# Patient Record
Sex: Female | Born: 1950 | Race: White | Hispanic: No | Smoking: Never smoker
Health system: Southern US, Community
[De-identification: ages and names within clinical notes are randomized; demographics above are authoritative.]

## PROBLEM LIST (undated history)

## (undated) DIAGNOSIS — E785 Hyperlipidemia, unspecified: Secondary | ICD-10-CM

## (undated) DIAGNOSIS — H409 Unspecified glaucoma: Secondary | ICD-10-CM

## (undated) HISTORY — PX: BREAST BIOPSY: SHX20

## (undated) HISTORY — PX: ABDOMINAL HERNIA REPAIR: SHX539

## (undated) HISTORY — PX: CHOLECYSTECTOMY, LAPAROSCOPIC: SHX56

## (undated) HISTORY — PX: TONSILLECTOMY: SUR1361

---

## 1972-08-31 HISTORY — PX: APPENDECTOMY: SHX54

## 2018-07-21 ENCOUNTER — Other Ambulatory Visit: Payer: Self-pay | Admitting: Family Medicine

## 2018-07-21 DIAGNOSIS — Z1231 Encounter for screening mammogram for malignant neoplasm of breast: Secondary | ICD-10-CM

## 2019-03-07 ENCOUNTER — Other Ambulatory Visit (HOSPITAL_BASED_OUTPATIENT_CLINIC_OR_DEPARTMENT_OTHER): Payer: Self-pay | Admitting: Family Medicine

## 2019-03-07 DIAGNOSIS — R7989 Other specified abnormal findings of blood chemistry: Secondary | ICD-10-CM

## 2019-03-09 ENCOUNTER — Other Ambulatory Visit: Payer: Self-pay

## 2019-03-09 ENCOUNTER — Ambulatory Visit (HOSPITAL_BASED_OUTPATIENT_CLINIC_OR_DEPARTMENT_OTHER)
Admission: RE | Admit: 2019-03-09 | Discharge: 2019-03-09 | Disposition: A | Discharge status: Home / Self Care | Payer: Medicare Other | Source: Ambulatory Visit | Attending: Family Medicine | Admitting: Family Medicine

## 2019-03-09 ENCOUNTER — Encounter (HOSPITAL_BASED_OUTPATIENT_CLINIC_OR_DEPARTMENT_OTHER): Payer: Self-pay

## 2019-03-09 DIAGNOSIS — R945 Abnormal results of liver function studies: Secondary | ICD-10-CM | POA: Diagnosis not present

## 2019-03-09 DIAGNOSIS — R7989 Other specified abnormal findings of blood chemistry: Secondary | ICD-10-CM

## 2019-03-09 HISTORY — DX: Hyperlipidemia, unspecified: E78.5

## 2019-03-09 HISTORY — DX: Unspecified glaucoma: H40.9

## 2019-06-28 ENCOUNTER — Other Ambulatory Visit: Payer: Self-pay

## 2019-06-28 DIAGNOSIS — Z20822 Contact with and (suspected) exposure to covid-19: Secondary | ICD-10-CM

## 2019-06-29 LAB — NOVEL CORONAVIRUS, NAA: SARS-CoV-2, NAA: NOT DETECTED

## 2019-09-20 ENCOUNTER — Ambulatory Visit (INDEPENDENT_AMBULATORY_CARE_PROVIDER_SITE_OTHER): Payer: Medicare Other | Admitting: Otolaryngology

## 2019-09-20 ENCOUNTER — Other Ambulatory Visit: Payer: Self-pay

## 2019-09-20 ENCOUNTER — Encounter (INDEPENDENT_AMBULATORY_CARE_PROVIDER_SITE_OTHER): Payer: Self-pay | Admitting: Otolaryngology

## 2019-09-20 VITALS — Temp 98.1°F

## 2019-09-20 DIAGNOSIS — M26609 Unspecified temporomandibular joint disorder, unspecified side: Secondary | ICD-10-CM | POA: Diagnosis not present

## 2019-09-20 DIAGNOSIS — H9202 Otalgia, left ear: Secondary | ICD-10-CM | POA: Diagnosis not present

## 2019-09-20 NOTE — Progress Notes (Signed)
HPI: Robyn Bennett is a 69 y.o. female who presents is referred by Dr. Drema Dallas for evaluation of chronic left ear pain. This comes and goes. She has had this for a number of years. She has not noted any hearing problems. Denies any sore throat. She is not sure what makes it worse or better.. She has had no drainage from the ear.  Past Medical History:  Diagnosis Date  . Glaucoma    uses medication (drops)  . Hyperlipemia    meds    Social History   Socioeconomic History  . Marital status: Divorced    Spouse name: Not on file  . Number of children: Not on file  . Years of education: Not on file  . Highest education level: Not on file  Occupational History  . Not on file  Tobacco Use  . Smoking status: Never Smoker  . Smokeless tobacco: Never Used  Substance and Sexual Activity  . Alcohol use: Not Currently  . Drug use: Never  . Sexual activity: Not Currently    Birth control/protection: Post-menopausal  Other Topics Concern  . Not on file  Social History Narrative  . Not on file   Social Determinants of Health   Financial Resource Strain:   . Difficulty of Paying Living Expenses: Not on file  Food Insecurity:   . Worried About Charity fundraiser in the Last Year: Not on file  . Ran Out of Food in the Last Year: Not on file  Transportation Needs:   . Lack of Transportation (Medical): Not on file  . Lack of Transportation (Non-Medical): Not on file  Physical Activity:   . Days of Exercise per Week: Not on file  . Minutes of Exercise per Session: Not on file  Stress:   . Feeling of Stress : Not on file  Social Connections:   . Frequency of Communication with Friends and Family: Not on file  . Frequency of Social Gatherings with Friends and Family: Not on file  . Attends Religious Services: Not on file  . Active Member of Clubs or Organizations: Not on file  . Attends Archivist Meetings: Not on file  . Marital Status: Not on file   No family history  on file. Allergies  Allergen Reactions  . Aspirin     Cause her stomach to bleed   Prior to Admission medications   Not on File     Positive ROS: Otherwise negative  All other systems have been reviewed and were otherwise negative with the exception of those mentioned in the HPI and as above.  Physical Exam: Constitutional: Alert, well-appearing, no acute distress Ears: External ears without lesions or tenderness. She had moderate wax buildup on the right side that was removed with suction. The TM on the right side was normal with normal mobility on pneumatic otoscopy. Left ear canal had minimal cerumen. The TM itself was clear with good mobility on pneumatic otoscopy. On hearing screening with a 512 & 1024 tuning fork she heard well in both ears with AC > BC bilaterally. No pain on evaluation of the ear. Nasal: External nose without lesions. Septum with minimal deformity.. Clear nasal passages with no signs of infection. Oral: Lips and gums without lesions. Tongue and palate mucosa without lesions. Posterior oropharynx clear. Patient is status post tonsillectomy. Indirect laryngoscopy revealed a clear hypopharynx and larynx. Neck: No palpable adenopathy or masses. She does have mild clicking or popping on opening closing the left TMJ joint although  this is not really painful on exam today. Respiratory: Breathing comfortably  Skin: No facial/neck lesions or rash noted.  Procedures  Assessment: Left ear pain questionable etiology. Normal ear evaluation. 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  Plan: At this point since she has had this for years did not recommend any specific therapy outside of possibly use of NSAID such as ibuprofen or Advil twice daily for 7 to 10 days when the pain is worse.   Radene Journey, MD   CC: Leighton Ruff, MD

## 2020-01-04 ENCOUNTER — Encounter: Payer: Self-pay | Admitting: Neurology

## 2020-03-26 NOTE — Progress Notes (Signed)
NEUROLOGY CONSULTATION NOTE  Robyn Bennett MRN: [id number] DOB: 08/30/1951  Referring provider: Leighton Ruff, MD Primary care provider: Leighton Ruff, MD  Reason for consult:  headache  HISTORY OF PRESENT ILLNESS: Robyn Bennett is a 69 year old ambidextrous female with glaucoma and cataracts who presents for headache and dizziness  She has history of headaches with photophobia.    In June, she was watching TV and her vision became unfocused like cross-eyed.  She felt a severe pressure in her head, like a helmet over her head lasting 5 minutes.  She did not experience numbness or weakness.  She has never experienced this before.    She was evaluated by ENT in January for chronic left ear pain, who thought she may have mild left TMJ dysfunction.  Sleep hygiene:  Mother (recurrent TIAs), daughter (MS)  08/16/2019 LABS:  Hgb A1c 5.1; CMP with Na 142, K 5, Cl 106, CO2 26, glucose 111, BUN 6, Cr 0.56, t bili 0.7, ALP 79,  AST 26, ALT 27; LDL 110   PAST MEDICAL HISTORY: Past Medical History:  Diagnosis Date  . Glaucoma    uses medication (drops)  . Hyperlipemia    meds    PAST SURGICAL HISTORY: Past Surgical History:  Procedure Laterality Date  . ABDOMINAL HERNIA REPAIR     x 2  . APPENDECTOMY  1974  . CESAREAN SECTION  1975  . CHOLECYSTECTOMY, LAPAROSCOPIC    . TONSILLECTOMY     MEDICATIONS: No current outpatient medications on file prior to visit.   No current facility-administered medications on file prior to visit.    ALLERGIES: Allergies  Allergen Reactions  . Aspirin     Cause her stomach to bleed    FAMILY HISTORY: Family History  Problem Relation Age of Onset  . Stroke Mother   . Stroke Father   . Heart attack Father   . Multiple sclerosis Other     SOCIAL HISTORY: Social History   Socioeconomic History  . Marital status: Divorced    Spouse name: Not on file  . Number of children: Not on file  . Years of education: Not on file  .  Highest education level: Not on file  Occupational History  . Not on file  Tobacco Use  . Smoking status: Never Smoker  . Smokeless tobacco: Never Used  Vaping Use  . Vaping Use: Never used  Substance and Sexual Activity  . Alcohol use: Not Currently  . Drug use: Never  . Sexual activity: Not Currently    Birth control/protection: Post-menopausal  Other Topics Concern  . Not on file  Social History Narrative  . Not on file   Social Determinants of Health   Financial Resource Strain:   . Difficulty of Paying Living Expenses:   Food Insecurity:   . Worried About Charity fundraiser in the Last Year:   . Arboriculturist in the Last Year:   Transportation Needs:   . Film/video editor (Medical):   Marland Kitchen Lack of Transportation (Non-Medical):   Physical Activity:   . Days of Exercise per Week:   . Minutes of Exercise per Session:   Stress:   . Feeling of Stress :   Social Connections:   . Frequency of Communication with Friends and Family:   . Frequency of Social Gatherings with Friends and Family:   . Attends Religious Services:   . Active Member of Clubs or Organizations:   . Attends Archivist Meetings:   .  Marital Status:   Intimate Partner Violence:   . Fear of Current or Ex-Partner:   . Emotionally Abused:   Marland Kitchen Physically Abused:   . Sexually Abused:     PHYSICAL EXAM: Blood pressure (!) 141/79, pulse 80, height 4\' 10"  (1.473 m), weight 164 lb 9.6 oz (74.7 kg), SpO2 96 %. General: No acute distress.  Patient appears well-groomed.   Head:  Normocephalic/atraumatic Eyes:  fundi examined but not visualized Neck: supple, no paraspinal tenderness, full range of motion Back: No paraspinal tenderness Heart: regular rate and rhythm Lungs: Clear to auscultation bilaterally. Vascular: No carotid bruits. Neurological Exam: Mental status: alert and oriented to person, place, and time, recent and remote memory intact, fund of knowledge intact, attention and  concentration intact, speech fluent and not dysarthric, language intact. Cranial nerves: CN I: not tested CN II: pupils equal, round and reactive to light, visual fields intact CN III, IV, VI:  full range of motion, no nystagmus, no ptosis CN V: facial sensation intact CN VII: upper and lower face symmetric CN VIII: hearing intact CN IX, X: gag intact, uvula midline CN XI: sternocleidomastoid and trapezius muscles intact CN XII: tongue midline Bulk & Tone: normal, no fasciculations. Motor:  5/5 throughout  Sensation:  Pinprick and vibration sensation intact.   Deep Tendon Reflexes:  2+ throughout, toes downgoing.  Finger to nose testing:  Without dysmetria.   Heel to shin:  Without dysmetria.   Gait:  Normal station and stride.  Able to turn and tandem walk. Romberg negative.  IMPRESSION: Transient episode of visual disturbance and headache, new onset  PLAN: MRI of brain with and without contrast Further recommendations pending results.  Thank you for allowing me to take part in the care of this patient.  Metta Clines, DO  CC:  Leighton Ruff, MD

## 2020-03-27 ENCOUNTER — Encounter: Payer: Self-pay | Admitting: Neurology

## 2020-03-27 ENCOUNTER — Other Ambulatory Visit: Payer: Self-pay

## 2020-03-27 ENCOUNTER — Ambulatory Visit (INDEPENDENT_AMBULATORY_CARE_PROVIDER_SITE_OTHER): Payer: Medicare Other | Admitting: Neurology

## 2020-03-27 VITALS — BP 141/79 | HR 80 | Ht <= 58 in | Wt 164.6 lb

## 2020-03-27 DIAGNOSIS — R519 Headache, unspecified: Secondary | ICD-10-CM | POA: Diagnosis not present

## 2020-03-27 DIAGNOSIS — H539 Unspecified visual disturbance: Secondary | ICD-10-CM

## 2020-03-27 NOTE — Patient Instructions (Signed)
We will check MRI of brain with and without contrast Further recommendations pending results.   

## 2020-04-20 ENCOUNTER — Ambulatory Visit
Admission: RE | Admit: 2020-04-20 | Discharge: 2020-04-20 | Disposition: A | Discharge status: Home / Self Care | Payer: Medicare Other | Source: Ambulatory Visit | Attending: Neurology | Admitting: Neurology

## 2020-04-20 ENCOUNTER — Other Ambulatory Visit: Payer: Self-pay

## 2020-04-20 DIAGNOSIS — H539 Unspecified visual disturbance: Secondary | ICD-10-CM

## 2020-04-20 DIAGNOSIS — R519 Headache, unspecified: Secondary | ICD-10-CM

## 2020-04-20 MED ORDER — GADOBENATE DIMEGLUMINE 529 MG/ML IV SOLN
13.0000 mL | Freq: Once | INTRAVENOUS | Status: AC | PRN
  Administered 2020-04-20: 13 mL via INTRAVENOUS

## 2020-09-12 DIAGNOSIS — R030 Elevated blood-pressure reading, without diagnosis of hypertension: Secondary | ICD-10-CM | POA: Diagnosis not present

## 2020-09-12 DIAGNOSIS — R519 Headache, unspecified: Secondary | ICD-10-CM | POA: Diagnosis not present

## 2020-10-07 DIAGNOSIS — H04123 Dry eye syndrome of bilateral lacrimal glands: Secondary | ICD-10-CM | POA: Diagnosis not present

## 2020-10-07 DIAGNOSIS — H401131 Primary open-angle glaucoma, bilateral, mild stage: Secondary | ICD-10-CM | POA: Diagnosis not present

## 2020-10-10 DIAGNOSIS — R0981 Nasal congestion: Secondary | ICD-10-CM | POA: Diagnosis not present

## 2020-10-10 DIAGNOSIS — E782 Mixed hyperlipidemia: Secondary | ICD-10-CM | POA: Diagnosis not present

## 2020-10-10 DIAGNOSIS — H04123 Dry eye syndrome of bilateral lacrimal glands: Secondary | ICD-10-CM | POA: Diagnosis not present

## 2020-10-10 DIAGNOSIS — K219 Gastro-esophageal reflux disease without esophagitis: Secondary | ICD-10-CM | POA: Diagnosis not present

## 2020-10-10 DIAGNOSIS — R0989 Other specified symptoms and signs involving the circulatory and respiratory systems: Secondary | ICD-10-CM | POA: Diagnosis not present

## 2020-10-10 DIAGNOSIS — E6609 Other obesity due to excess calories: Secondary | ICD-10-CM | POA: Diagnosis not present

## 2020-10-10 DIAGNOSIS — H409 Unspecified glaucoma: Secondary | ICD-10-CM | POA: Diagnosis not present

## 2020-10-10 DIAGNOSIS — Z6834 Body mass index (BMI) 34.0-34.9, adult: Secondary | ICD-10-CM | POA: Diagnosis not present

## 2020-10-14 DIAGNOSIS — R0989 Other specified symptoms and signs involving the circulatory and respiratory systems: Secondary | ICD-10-CM | POA: Diagnosis not present

## 2020-10-14 DIAGNOSIS — E782 Mixed hyperlipidemia: Secondary | ICD-10-CM | POA: Diagnosis not present

## 2020-10-14 DIAGNOSIS — R0981 Nasal congestion: Secondary | ICD-10-CM | POA: Diagnosis not present

## 2020-10-14 DIAGNOSIS — R7301 Impaired fasting glucose: Secondary | ICD-10-CM | POA: Diagnosis not present

## 2020-10-14 DIAGNOSIS — H04123 Dry eye syndrome of bilateral lacrimal glands: Secondary | ICD-10-CM | POA: Diagnosis not present

## 2020-10-14 DIAGNOSIS — H409 Unspecified glaucoma: Secondary | ICD-10-CM | POA: Diagnosis not present

## 2020-10-14 DIAGNOSIS — E6609 Other obesity due to excess calories: Secondary | ICD-10-CM | POA: Diagnosis not present

## 2020-10-14 DIAGNOSIS — Z6834 Body mass index (BMI) 34.0-34.9, adult: Secondary | ICD-10-CM | POA: Diagnosis not present

## 2020-10-14 DIAGNOSIS — K219 Gastro-esophageal reflux disease without esophagitis: Secondary | ICD-10-CM | POA: Diagnosis not present

## 2021-01-03 DIAGNOSIS — R109 Unspecified abdominal pain: Secondary | ICD-10-CM | POA: Diagnosis not present

## 2021-01-06 ENCOUNTER — Other Ambulatory Visit: Payer: Self-pay | Admitting: Family Medicine

## 2021-01-06 DIAGNOSIS — Z1231 Encounter for screening mammogram for malignant neoplasm of breast: Secondary | ICD-10-CM

## 2021-01-06 DIAGNOSIS — R109 Unspecified abdominal pain: Secondary | ICD-10-CM | POA: Diagnosis not present

## 2021-01-08 ENCOUNTER — Inpatient Hospital Stay: Admission: RE | Admit: 2021-01-08 | Payer: Medicare Other | Source: Ambulatory Visit

## 2021-01-18 ENCOUNTER — Ambulatory Visit
Admission: RE | Admit: 2021-01-18 | Discharge: 2021-01-18 | Disposition: A | Discharge status: Home / Self Care | Payer: Medicare Other | Source: Ambulatory Visit | Attending: Family Medicine | Admitting: Family Medicine

## 2021-01-18 ENCOUNTER — Other Ambulatory Visit: Payer: Self-pay

## 2021-01-18 DIAGNOSIS — Z1231 Encounter for screening mammogram for malignant neoplasm of breast: Secondary | ICD-10-CM

## 2021-01-30 ENCOUNTER — Other Ambulatory Visit: Payer: Self-pay | Admitting: Family Medicine

## 2021-01-30 DIAGNOSIS — R928 Other abnormal and inconclusive findings on diagnostic imaging of breast: Secondary | ICD-10-CM

## 2021-02-11 DIAGNOSIS — Z23 Encounter for immunization: Secondary | ICD-10-CM | POA: Diagnosis not present

## 2021-02-11 DIAGNOSIS — K7689 Other specified diseases of liver: Secondary | ICD-10-CM | POA: Diagnosis not present

## 2021-02-11 DIAGNOSIS — E782 Mixed hyperlipidemia: Secondary | ICD-10-CM | POA: Diagnosis not present

## 2021-02-11 DIAGNOSIS — K219 Gastro-esophageal reflux disease without esophagitis: Secondary | ICD-10-CM | POA: Diagnosis not present

## 2021-02-11 DIAGNOSIS — R109 Unspecified abdominal pain: Secondary | ICD-10-CM | POA: Diagnosis not present

## 2021-02-11 DIAGNOSIS — E6609 Other obesity due to excess calories: Secondary | ICD-10-CM | POA: Diagnosis not present

## 2021-02-11 DIAGNOSIS — H409 Unspecified glaucoma: Secondary | ICD-10-CM | POA: Diagnosis not present

## 2021-02-12 ENCOUNTER — Other Ambulatory Visit: Payer: Self-pay | Admitting: Family Medicine

## 2021-02-12 DIAGNOSIS — H401131 Primary open-angle glaucoma, bilateral, mild stage: Secondary | ICD-10-CM | POA: Diagnosis not present

## 2021-02-12 DIAGNOSIS — H04123 Dry eye syndrome of bilateral lacrimal glands: Secondary | ICD-10-CM | POA: Diagnosis not present

## 2021-02-19 ENCOUNTER — Ambulatory Visit
Admission: RE | Admit: 2021-02-19 | Discharge: 2021-02-19 | Disposition: A | Discharge status: Home / Self Care | Payer: Medicare Other | Source: Ambulatory Visit | Attending: Family Medicine | Admitting: Family Medicine

## 2021-02-19 ENCOUNTER — Other Ambulatory Visit: Payer: Self-pay | Admitting: Family Medicine

## 2021-02-19 ENCOUNTER — Other Ambulatory Visit: Payer: Self-pay

## 2021-02-19 DIAGNOSIS — R922 Inconclusive mammogram: Secondary | ICD-10-CM | POA: Diagnosis not present

## 2021-02-19 DIAGNOSIS — R109 Unspecified abdominal pain: Secondary | ICD-10-CM

## 2021-02-19 DIAGNOSIS — R928 Other abnormal and inconclusive findings on diagnostic imaging of breast: Secondary | ICD-10-CM

## 2021-02-19 DIAGNOSIS — N6002 Solitary cyst of left breast: Secondary | ICD-10-CM | POA: Diagnosis not present

## 2021-03-14 ENCOUNTER — Ambulatory Visit
Admission: RE | Admit: 2021-03-14 | Discharge: 2021-03-14 | Disposition: A | Discharge status: Home / Self Care | Payer: Medicare Other | Source: Ambulatory Visit | Attending: Family Medicine | Admitting: Family Medicine

## 2021-03-14 DIAGNOSIS — R1011 Right upper quadrant pain: Secondary | ICD-10-CM | POA: Diagnosis not present

## 2021-03-14 DIAGNOSIS — Z9049 Acquired absence of other specified parts of digestive tract: Secondary | ICD-10-CM | POA: Diagnosis not present

## 2021-03-14 DIAGNOSIS — R109 Unspecified abdominal pain: Secondary | ICD-10-CM

## 2021-03-14 DIAGNOSIS — K7689 Other specified diseases of liver: Secondary | ICD-10-CM | POA: Diagnosis not present

## 2021-04-14 DIAGNOSIS — K7689 Other specified diseases of liver: Secondary | ICD-10-CM | POA: Diagnosis not present

## 2021-04-14 DIAGNOSIS — E6609 Other obesity due to excess calories: Secondary | ICD-10-CM | POA: Diagnosis not present

## 2021-04-14 DIAGNOSIS — K219 Gastro-esophageal reflux disease without esophagitis: Secondary | ICD-10-CM | POA: Diagnosis not present

## 2021-04-14 DIAGNOSIS — H409 Unspecified glaucoma: Secondary | ICD-10-CM | POA: Diagnosis not present

## 2021-04-14 DIAGNOSIS — R109 Unspecified abdominal pain: Secondary | ICD-10-CM | POA: Diagnosis not present

## 2021-04-14 DIAGNOSIS — E782 Mixed hyperlipidemia: Secondary | ICD-10-CM | POA: Diagnosis not present

## 2021-05-13 ENCOUNTER — Other Ambulatory Visit: Payer: Self-pay

## 2021-05-13 ENCOUNTER — Encounter: Payer: Self-pay | Admitting: Dietician

## 2021-05-13 ENCOUNTER — Encounter: Payer: Medicare Other | Attending: Family Medicine | Admitting: Dietician

## 2021-05-13 VITALS — Ht <= 58 in | Wt 162.0 lb

## 2021-05-13 DIAGNOSIS — R7301 Impaired fasting glucose: Secondary | ICD-10-CM | POA: Diagnosis not present

## 2021-05-13 DIAGNOSIS — E669 Obesity, unspecified: Secondary | ICD-10-CM

## 2021-05-13 DIAGNOSIS — E781 Pure hyperglyceridemia: Secondary | ICD-10-CM | POA: Diagnosis not present

## 2021-05-13 DIAGNOSIS — E6609 Other obesity due to excess calories: Secondary | ICD-10-CM | POA: Insufficient documentation

## 2021-05-13 DIAGNOSIS — Z713 Dietary counseling and surveillance: Secondary | ICD-10-CM | POA: Diagnosis not present

## 2021-05-13 DIAGNOSIS — E785 Hyperlipidemia, unspecified: Secondary | ICD-10-CM | POA: Insufficient documentation

## 2021-05-13 DIAGNOSIS — Z6833 Body mass index (BMI) 33.0-33.9, adult: Secondary | ICD-10-CM | POA: Insufficient documentation

## 2021-05-13 NOTE — Patient Instructions (Addendum)
When having vegetables, make sure they are soft cooked and not raw. Try this for a few weeks and assess stomach pain.  Switch from eating nuts and seeds to nut butters (peanut butter, almond butter, cashew butter) or dehydrated PB for protein. Assess any change in lower stomach discomfort.  Begin a log/journal of your food choices and gastrointestinal symptoms. Bring your log to our follow up.   Portion your almonds into a small bowl if you are going to snack on them. Do not bring the entire bag with you. Choose a cup of diced/bite size fruits to snack on instead.   Try Fairlife protein drinks as part of breakfast.  Take a walk on your lunch break at work as many days as possible!

## 2021-05-13 NOTE — Progress Notes (Signed)
Medical Nutrition Therapy  Appointment Start time:  [id number]  Appointment End time:  0945  Primary concerns today: GI Discomfort, Fatty Liver  Referral diagnosis: E78.1 - Hypertriglyceridemia, E66.09 - Other obesity due to excess calories, R73.01 - Elevated fasting glucose Preferred learning style: No preference indicated Learning readiness: Ready   NUTRITION ASSESSMENT   Anthropometrics  Ht: 4'10" Wt: 162 lbs Body mass index is 33.86 kg/m.   Clinical Medical Hx: HLD, Diverticulitis, Gastrits, GERD, Fatty Liver, Ulcers Medications: Pantoprazole, Ezetimibe, Alphagan Labs: TC - 269, TGL - 279, LDL - 172 Notable Signs/Symptoms: RLQ pain  Lifestyle & Dietary Hx Pt reports history of diverticulitis in their RLQ, has not had a colonoscopy in 8-10 years. Pt reports consistent RLQ pain, has appointment with a gastroenterologist later this month. Pt reports history of fatty liver and 2 small cysts on their liver. Pt just started taking milk thistle for their liver. 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 Pt takes an hour lunch break each day.No structured exercise or physical activity. Pt does not eat meat very often, may have poultry rarely. Pt eats nuts, seeds, lentils, quinoa, beans, and egg whites for protein.  Pt reports snacking on almonds in the evening while watching TV, states they do not pay attention to how much they eat. Pt is concerned that they are eating too many calories without realizing it. Pt is lactose intolerant.   Estimated daily fluid intake: 48 oz Supplements: Probiotic, Milk Thistle, Vitamin B12 Sleep: Poor, trouble staying asleep Stress / self-care: Work stress, co-workers Current average weekly physical activity: ADLs   24-Hr Dietary Recall First Meal: 1 cup off coffee, 1 slice whole grain toast Snack: none Second Meal: Quinoa, feta cheese, cooked spinach, avocado, lemon juice Snack: none Third Meal: [place] sandwich on whole wheat bread,  Ginger ale Zero Snack: Almonds Beverages: coffee, ginger ale.   NUTRITION DIAGNOSIS  NB-1.7 Undesireable food choices As related to fatty liver.  As evidenced by serum triglycerides of 279 mg/dL, serum LDL of 172 mg/dL,and diet history high in fat.   NUTRITION INTERVENTION  Nutrition education (E-1) on the following topics:  Educated patient on the balanced plate eating model. Recommended lunch and dinner be 1/2 non-starchy vegetables, 1/4 starches, and 1/4 protein. Recommended breakfast be a balance of starch and protein with a piece of fruit. Discussed with patient the importance of working towards hitting the proportions of the balanced plate consistently. Counseled patient on ways to begin recognizing each of the food groups from the balanced plate in their own meals, and how close they are to fitting the recommended proportions of the balanced plate. Advised patient to evaluate whether the impulse to eat is hunger based, or boredom driven snacking. Educate pt on the difference between LDL and HDL cholesterol. Educate pt on the factors that can increase and decrease HDL cholesterol. Including exercise to increase HDL, and tobacco use to decrease HDL. Educate pt on factors that can elevate LDL cholesterol, including high dietary intake of saturated fats. Educate pt on identifying sources of saturated fats, and how to make alternative food choices to lower saturated fat intake. Educate pt on the role of soluble fiber in binding to cholesterol in the GI tract an eliminating it from the body. Educate pt on dietary sources of soluble fiber. Educate pt on the potential dietary causes of hypertriglyceridemia.Educate on the role of elevated LDL,total cholesterol, and triglycerides on cardiovascular health. Educate pt on the role of physical activity in lowering  LDL and increasing HDL cholesterol. Educated patient on dietary items that may exacerbate their diverticulitis/RLQ pain. Advise patient to soft cook  vegetables, and choose nut butters instead of whole nuts and seeds.   Handouts Provided Include  Balanced Plate Balanced Plate Food List Snack Time  Learning Style & Readiness for Change Teaching method utilized: Visual & Auditory  Demonstrated degree of understanding via: Teach Back  Barriers to learning/adherence to lifestyle change: None  Goals Established by Pt When having vegetables, make sure they are soft cooked and not raw. Try this for a few weeks and assess stomach pain. Switch from eating nuts and seeds to nut butters (peanut butter, almond butter, cashew butter) or dehydrated PB for protein. Assess any change in lower stomach discomfort. Begin a log/journal of your food choices and gastrointestinal symptoms. Bring your log to our follow up.  Portion your almonds into a small bowl if you are going to snack on them. Do not bring the entire bag with you. Choose a cup of diced/bite size fruits to snack on instead.  Try Fairlife protein drinks as part of breakfast. Take a walk on your lunch break at work as many days as possible!   MONITORING & EVALUATION Dietary intake, weekly physical activity, and food/symptoms log in 6 weeks.  Next Steps  Patient is to log their food choices and related symptoms, bring log to follow up.

## 2021-05-20 DIAGNOSIS — R109 Unspecified abdominal pain: Secondary | ICD-10-CM | POA: Diagnosis not present

## 2021-05-20 DIAGNOSIS — Z8719 Personal history of other diseases of the digestive system: Secondary | ICD-10-CM | POA: Diagnosis not present

## 2021-05-20 DIAGNOSIS — K219 Gastro-esophageal reflux disease without esophagitis: Secondary | ICD-10-CM | POA: Diagnosis not present

## 2021-05-20 DIAGNOSIS — Z86010 Personal history of colonic polyps: Secondary | ICD-10-CM | POA: Diagnosis not present

## 2021-05-20 DIAGNOSIS — K760 Fatty (change of) liver, not elsewhere classified: Secondary | ICD-10-CM | POA: Diagnosis not present

## 2021-06-10 DIAGNOSIS — D123 Benign neoplasm of transverse colon: Secondary | ICD-10-CM | POA: Diagnosis not present

## 2021-06-10 DIAGNOSIS — R1013 Epigastric pain: Secondary | ICD-10-CM | POA: Diagnosis not present

## 2021-06-10 DIAGNOSIS — K2930 Chronic superficial gastritis without bleeding: Secondary | ICD-10-CM | POA: Diagnosis not present

## 2021-06-10 DIAGNOSIS — Z86010 Personal history of colonic polyps: Secondary | ICD-10-CM | POA: Diagnosis not present

## 2021-06-10 DIAGNOSIS — K5730 Diverticulosis of large intestine without perforation or abscess without bleeding: Secondary | ICD-10-CM | POA: Diagnosis not present

## 2021-06-16 DIAGNOSIS — H04123 Dry eye syndrome of bilateral lacrimal glands: Secondary | ICD-10-CM | POA: Diagnosis not present

## 2021-06-16 DIAGNOSIS — H2513 Age-related nuclear cataract, bilateral: Secondary | ICD-10-CM | POA: Diagnosis not present

## 2021-06-16 DIAGNOSIS — H401131 Primary open-angle glaucoma, bilateral, mild stage: Secondary | ICD-10-CM | POA: Diagnosis not present

## 2021-06-19 DIAGNOSIS — K2930 Chronic superficial gastritis without bleeding: Secondary | ICD-10-CM | POA: Diagnosis not present

## 2021-06-19 DIAGNOSIS — D123 Benign neoplasm of transverse colon: Secondary | ICD-10-CM | POA: Diagnosis not present

## 2021-07-01 ENCOUNTER — Encounter: Payer: Self-pay | Admitting: Dietician

## 2021-07-01 ENCOUNTER — Other Ambulatory Visit: Payer: Self-pay

## 2021-07-01 ENCOUNTER — Encounter: Payer: Medicare Other | Attending: Family Medicine | Admitting: Dietician

## 2021-07-01 VITALS — Ht <= 58 in | Wt 154.9 lb

## 2021-07-01 DIAGNOSIS — R7301 Impaired fasting glucose: Secondary | ICD-10-CM | POA: Diagnosis not present

## 2021-07-01 DIAGNOSIS — K219 Gastro-esophageal reflux disease without esophagitis: Secondary | ICD-10-CM | POA: Diagnosis not present

## 2021-07-01 DIAGNOSIS — E781 Pure hyperglyceridemia: Secondary | ICD-10-CM | POA: Diagnosis not present

## 2021-07-01 DIAGNOSIS — E6609 Other obesity due to excess calories: Secondary | ICD-10-CM | POA: Insufficient documentation

## 2021-07-01 DIAGNOSIS — Z6832 Body mass index (BMI) 32.0-32.9, adult: Secondary | ICD-10-CM | POA: Diagnosis not present

## 2021-07-01 DIAGNOSIS — E669 Obesity, unspecified: Secondary | ICD-10-CM | POA: Insufficient documentation

## 2021-07-01 NOTE — Progress Notes (Signed)
Medical Nutrition Therapy  Appointment Start time:  0800  Appointment End time:  [phone]  Primary concerns today: GI Discomfort, Fatty Liver  Referral diagnosis: E78.1 - Hypertriglyceridemia, E66.09 - Other obesity due to excess calories, R73.01 - Elevated fasting glucose Preferred learning style: No preference indicated Learning readiness: Ready   NUTRITION ASSESSMENT   Anthropometrics  Ht: 4'10" Wt: 154.9 lbs Wt Change: -8 lbs Body mass index is 32.37 kg/m.   Clinical Medical Hx: HLD, Diverticulitis, Gastrits, GERD, Fatty Liver, Ulcers Medications: Pantoprazole, Ezetimibe, Alphagan Labs: TC - 269, TGL - 279, LDL - 172 Notable Signs/Symptoms: RLQ pain  Lifestyle & Dietary Hx Pt reports logging food choices, states it helps them to focus on what they are eating.Pt has been snacking less and eating more mindfully since logging foods. Pt reports losing weight for the first three weeks, but weight loss has plateaued in the last month. Pt had endoscopy and colonoscopy, removed a polyp. Gastritis and diverticulitis still present, no other abnormal findings. Pt is eating yogurt now, no side effects. Pt has been logging foods, found that whole nuts were causing GI pain. Pt reports eating more fruits now, melons, grapes, and strawberries. Pt noticed strawberries were causing GI discomfort, switched to blueberries.  Pt reports taking a break each day walking around the block at work each day. Pt reports stress level at work is much lower now.  Estimated daily fluid intake: 48 oz Supplements: Probiotic, Milk Thistle, Vitamin B12 Sleep: Poor, trouble staying asleep Stress / self-care: Work stress, co-workers Current average weekly physical activity: ADLs    24-Hr Dietary Recall First Meal: 1 cup off coffee, 1 slice whole grain toast, 1 egg white Snack: 1 banana Second Meal: Fruit (melon mix, grapes, pineapple) and greek yogurt Snack: none Third Meal: [place] Sprouts, peppers, 1  egg Snack: none Beverages: coffee, water   NUTRITION DIAGNOSIS  NB-1.7 Undesireable food choices As related to fatty liver.  As evidenced by serum triglycerides of 279 mg/dL, serum LDL of 172 mg/dL,and diet history high in fat.   NUTRITION INTERVENTION  Nutrition education (E-1) on the following topics:  Educated patient on the balanced plate eating model. Recommended lunch and dinner be 1/2 non-starchy vegetables, 1/4 starches, and 1/4 protein. Recommended breakfast be a balance of starch and protein with a piece of fruit. Discussed with patient the importance of working towards hitting the proportions of the balanced plate consistently. Counseled patient on ways to begin recognizing each of the food groups from the balanced plate in their own meals, and how close they are to fitting the recommended proportions of the balanced plate. Advised patient to evaluate whether the impulse to eat is hunger based, or boredom driven snacking. Educate pt on the difference between LDL and HDL cholesterol. Educate pt on the factors that can increase and decrease HDL cholesterol. Including exercise to increase HDL, and tobacco use to decrease HDL. Educate pt on factors that can elevate LDL cholesterol, including high dietary intake of saturated fats. Educate pt on identifying sources of saturated fats, and how to make alternative food choices to lower saturated fat intake. Educate pt on the role of soluble fiber in binding to cholesterol in the GI tract an eliminating it from the body. Educate pt on dietary sources of soluble fiber. Educate pt on the potential dietary causes of hypertriglyceridemia.Educate on the role of elevated LDL,total cholesterol, and triglycerides on cardiovascular health. Educate pt on the role of physical activity in lowering LDL and increasing HDL cholesterol. Educated patient  on dietary items that may exacerbate their diverticulitis/RLQ pain. Advise patient to soft cook vegetables, and  choose nut butters instead of whole nuts and seeds. NEW: Educated patient on the two components of energy balance: Energy in (calories), and energy out (activity). Explain the role of negative energy balance in weight loss. Discussed options with patient to achieve a negative energy balance and how to best control energy in and energy out to accommodate their lifestyle.    Handouts Provided Include  Balanced Plate Balanced Plate Food List Snack Time  Learning Style & Readiness for Change Teaching method utilized: Visual & Auditory  Demonstrated degree of understanding via: Teach Back  Barriers to learning/adherence to lifestyle change: None  Goals Established by Pt Add in a second walk around the block in the afternoon! When having your banana, chop up 1/2 of a banana for your serving size! Choose FAT-FREE greek yogurt like [employer]ikos "Triple Zero", Dannon "Light n Fit".  If buying larger containers of yogurt, look on the nutrition label for about 80-90 calories, and 12-15 g of protein per serving! Continue to log your food choices each day!   MONITORING & EVALUATION Dietary intake, weekly physical activity, and food/symptoms log in 2 months.  Next Steps  Patient is to log their food choices and related symptoms, bring log to follow up.

## 2021-07-01 NOTE — Patient Instructions (Addendum)
Add in a second walk around the block in the afternoon!  When having your banana, chop up 1/2 of a banana for your serving size!  Choose FAT-FREE greek yogurt like Oikos "Triple Zero", Dannon "Light n Fit".  If buying larger containers of yogurt, look on the nutrition label for about 80-90 calories, and 12-15 g of protein per serving!  Continue to log your food choices each day!

## 2021-07-03 DIAGNOSIS — K7689 Other specified diseases of liver: Secondary | ICD-10-CM | POA: Diagnosis not present

## 2021-07-03 DIAGNOSIS — E6609 Other obesity due to excess calories: Secondary | ICD-10-CM | POA: Diagnosis not present

## 2021-07-03 DIAGNOSIS — M1711 Unilateral primary osteoarthritis, right knee: Secondary | ICD-10-CM | POA: Diagnosis not present

## 2021-07-03 DIAGNOSIS — Z86010 Personal history of colonic polyps: Secondary | ICD-10-CM | POA: Diagnosis not present

## 2021-07-03 DIAGNOSIS — Z23 Encounter for immunization: Secondary | ICD-10-CM | POA: Diagnosis not present

## 2021-07-03 DIAGNOSIS — K219 Gastro-esophageal reflux disease without esophagitis: Secondary | ICD-10-CM | POA: Diagnosis not present

## 2021-07-03 DIAGNOSIS — H409 Unspecified glaucoma: Secondary | ICD-10-CM | POA: Diagnosis not present

## 2021-07-03 DIAGNOSIS — E782 Mixed hyperlipidemia: Secondary | ICD-10-CM | POA: Diagnosis not present

## 2021-07-08 DIAGNOSIS — E782 Mixed hyperlipidemia: Secondary | ICD-10-CM | POA: Diagnosis not present

## 2021-07-11 DIAGNOSIS — Z86010 Personal history of colonic polyps: Secondary | ICD-10-CM | POA: Diagnosis not present

## 2021-07-11 DIAGNOSIS — Z23 Encounter for immunization: Secondary | ICD-10-CM | POA: Diagnosis not present

## 2021-07-11 DIAGNOSIS — K5730 Diverticulosis of large intestine without perforation or abscess without bleeding: Secondary | ICD-10-CM | POA: Diagnosis not present

## 2021-07-11 DIAGNOSIS — K2930 Chronic superficial gastritis without bleeding: Secondary | ICD-10-CM | POA: Diagnosis not present

## 2021-07-23 DIAGNOSIS — Z20822 Contact with and (suspected) exposure to covid-19: Secondary | ICD-10-CM | POA: Diagnosis not present

## 2021-08-21 ENCOUNTER — Other Ambulatory Visit: Payer: Self-pay | Admitting: Family Medicine

## 2021-08-21 ENCOUNTER — Ambulatory Visit
Admission: RE | Admit: 2021-08-21 | Discharge: 2021-08-21 | Disposition: A | Discharge status: Home / Self Care | Payer: Medicare Other | Source: Ambulatory Visit | Attending: Family Medicine | Admitting: Family Medicine

## 2021-08-21 DIAGNOSIS — R928 Other abnormal and inconclusive findings on diagnostic imaging of breast: Secondary | ICD-10-CM

## 2021-08-21 DIAGNOSIS — N6322 Unspecified lump in the left breast, upper inner quadrant: Secondary | ICD-10-CM | POA: Diagnosis not present

## 2021-09-09 ENCOUNTER — Encounter: Payer: Medicare Other | Attending: Family Medicine | Admitting: Dietician

## 2021-09-09 ENCOUNTER — Other Ambulatory Visit: Payer: Self-pay

## 2021-09-09 VITALS — Ht <= 58 in | Wt 150.8 lb

## 2021-09-09 DIAGNOSIS — E781 Pure hyperglyceridemia: Secondary | ICD-10-CM | POA: Insufficient documentation

## 2021-09-09 DIAGNOSIS — Z6831 Body mass index (BMI) 31.0-31.9, adult: Secondary | ICD-10-CM | POA: Diagnosis not present

## 2021-09-09 DIAGNOSIS — R7301 Impaired fasting glucose: Secondary | ICD-10-CM | POA: Diagnosis not present

## 2021-09-09 DIAGNOSIS — Z713 Dietary counseling and surveillance: Secondary | ICD-10-CM | POA: Insufficient documentation

## 2021-09-09 DIAGNOSIS — E6609 Other obesity due to excess calories: Secondary | ICD-10-CM | POA: Diagnosis not present

## 2021-09-09 DIAGNOSIS — E782 Mixed hyperlipidemia: Secondary | ICD-10-CM | POA: Insufficient documentation

## 2021-09-09 NOTE — Patient Instructions (Addendum)
Keep up the great work!!! 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  Continue to walk every day and get back to your longer walks twice a day as your knee allows!  Work on having a source of protein at each meal!

## 2021-09-09 NOTE — Progress Notes (Signed)
Medical Nutrition Therapy  Appointment Start time:  0800  Appointment End time:  [phone]  Primary concerns today: GI Discomfort, Fatty Liver  Referral diagnosis: E78.1 - Hypertriglyceridemia, E66.09 - Other obesity due to excess calories, R73.01 - Elevated fasting glucose Preferred learning style: No preference indicated Learning readiness: Ready   NUTRITION ASSESSMENT   Anthropometrics  Ht: 4'10" Wt: 150.8 lbs Wt Change: -12 lbs Body mass index is 31.52 kg/m.   Clinical Medical Hx: HLD, Diverticulitis, Gastrits, GERD, Fatty Liver, Ulcers Medications: Pantoprazole, Ezetimibe, Alphagan Labs: (NEW 07/08/2021) TC - 207, TGL - 174, LDL - 135 All dramatically improved Notable Signs/Symptoms: RLQ pain  Lifestyle & Dietary Hx Pt reports going to visit family over the holidays, states they didn't eat as well or walk while on vacation. Pt states their family noticed their weight loss, and increase in energy level. This made pt very happy. Pt has begun to walk again since returning home. Pt reports some instability in their right knee that makes them uncomfortable at times, has history of ligament injury. Pt reports lingering RLQ pain, believes it may be related to hip pain. Pt reports changing positions at work, states they have an isolated space which has lowered stress for them considerably. Pt has continued with healthy dietary changes and continues to stay highly motivated!    Estimated daily fluid intake: 48 oz Supplements: Probiotic, Milk Thistle, Vitamin B12 Sleep: Poor, trouble staying asleep Stress / self-care: Work stress, co-workers Current average weekly physical activity: ADLs, walking    24-Hr Dietary Recall First Meal: 1 cup off coffee, 1 slice whole grain toast, 1/2 avocado Snack: greek yogurt Second Meal: Frozen fruit (cherries, pineapple, coconut, blueberries) Snack: none Third Meal: Spaghetti w/ meat sauce Snack: none Beverages: coffee, water   NUTRITION  DIAGNOSIS  NB-1.7 Undesireable food choices As related to fatty liver.  As evidenced by serum triglycerides of 279 mg/dL, serum LDL of 172 mg/dL,and diet history high in fat.   NUTRITION INTERVENTION  Nutrition education (E-1) on the following topics:  Educated patient on the balanced plate eating model. Recommended lunch and dinner be 1/2 non-starchy vegetables, 1/4 starches, and 1/4 protein. Recommended breakfast be a balance of starch and protein with a piece of fruit. Discussed with patient the importance of working towards hitting the proportions of the balanced plate consistently. Counseled patient on ways to begin recognizing each of the food groups from the balanced plate in their own meals, and how close they are to fitting the recommended proportions of the balanced plate. Advised patient to evaluate whether the impulse to eat is hunger based, or boredom driven snacking. Educate pt on the difference between LDL and HDL cholesterol. Educate pt on the factors that can increase and decrease HDL cholesterol. Including exercise to increase HDL, and tobacco use to decrease HDL. Educate pt on factors that can elevate LDL cholesterol, including high dietary intake of saturated fats. Educate pt on identifying sources of saturated fats, and how to make alternative food choices to lower saturated fat intake. Educate pt on the role of soluble fiber in binding to cholesterol in the GI tract an eliminating it from the body. Educate pt on dietary sources of soluble fiber. Educate pt on the potential dietary causes of hypertriglyceridemia.Educate on the role of elevated LDL,total cholesterol, and triglycerides on cardiovascular health. Educate pt on the role of physical activity in lowering LDL and increasing HDL cholesterol. Educated patient on dietary items that may exacerbate their diverticulitis/RLQ pain. Advise patient to soft cook  vegetables, and choose nut butters instead of whole nuts and seeds. NEW:  Educated patient on the two components of energy balance: Energy in (calories), and energy out (activity). Explain the role of negative energy balance in weight loss. Discussed options with patient to achieve a negative energy balance and how to best control energy in and energy out to accommodate their lifestyle.    Handouts Provided Include  Balanced Plate Balanced Plate Food List Snack Time  Learning Style & Readiness for Change Teaching method utilized: Visual & Auditory  Demonstrated degree of understanding via: Teach Back  Barriers to learning/adherence to lifestyle change: None  Goals Established by Pt Keep up the great work!!! 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 Continue to walk every day and get back to your longer walks twice a day as your knee allows! Work on having a source of protein at each meal!   MONITORING & EVALUATION Dietary intake, weekly physical activity, and food/symptoms log in 3 months.  Next Steps  Patient is to log their food choices and related symptoms, bring log to follow up.

## 2021-09-17 DIAGNOSIS — H401130 Primary open-angle glaucoma, bilateral, stage unspecified: Secondary | ICD-10-CM | POA: Diagnosis not present

## 2021-10-05 IMAGING — US US ABDOMEN COMPLETE
1 series · 14 of 25 positions shown · non-contrast
Comparison: Ultrasound 03/09/2019

CLINICAL DATA: Right upper quadrant pain

EXAM:
ABDOMEN ULTRASOUND COMPLETE

[Series 1: us abdomen complete · 0.23mm/px · 14 of 95 slices shown]
[im 1/95]
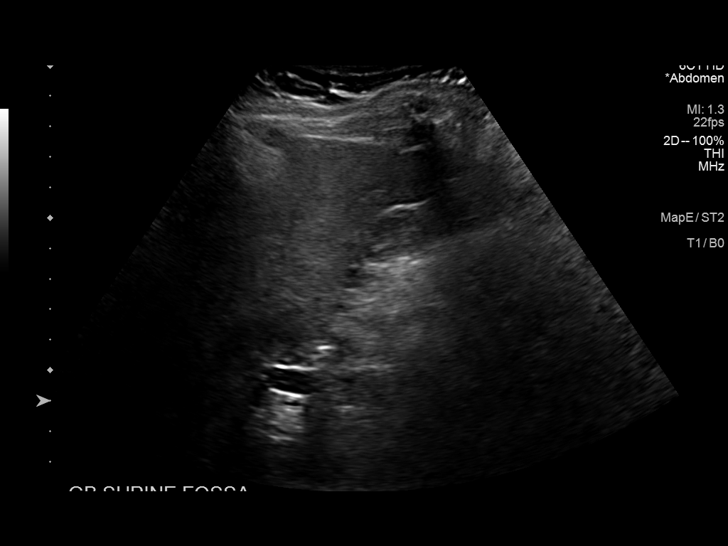
[im 8/95]
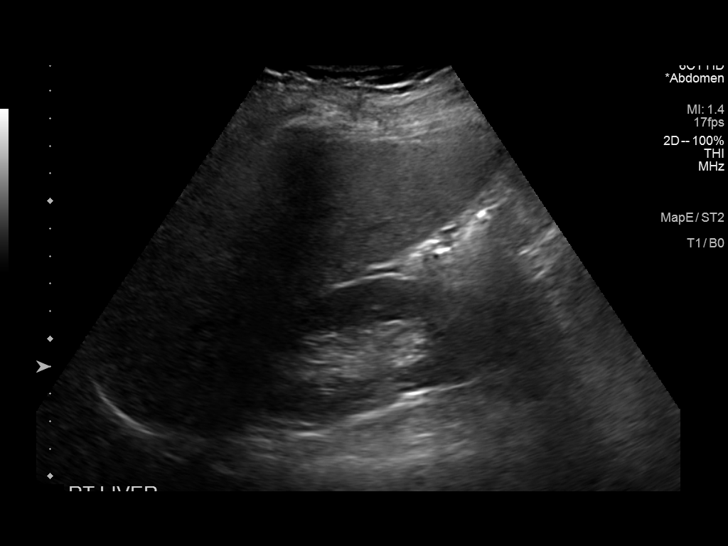
[im 16/95]
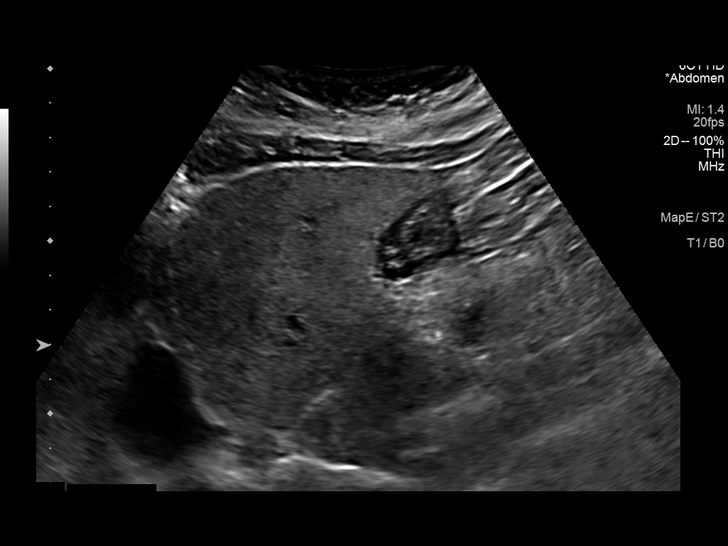
[im 24/95]
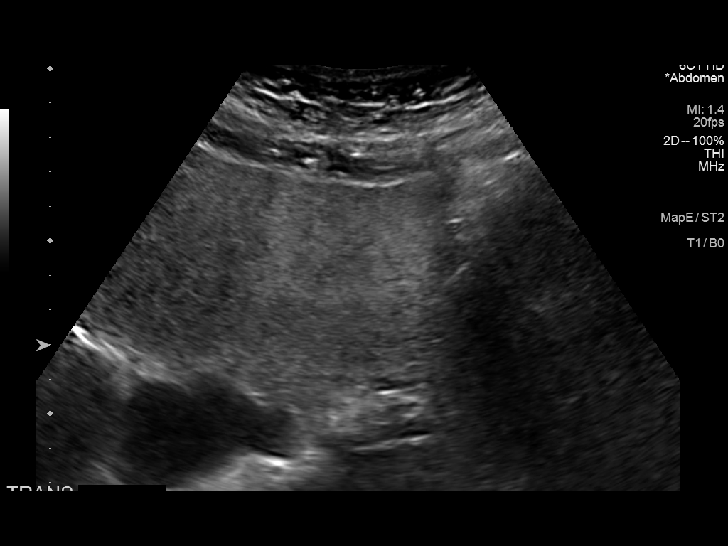
[im 32/95]
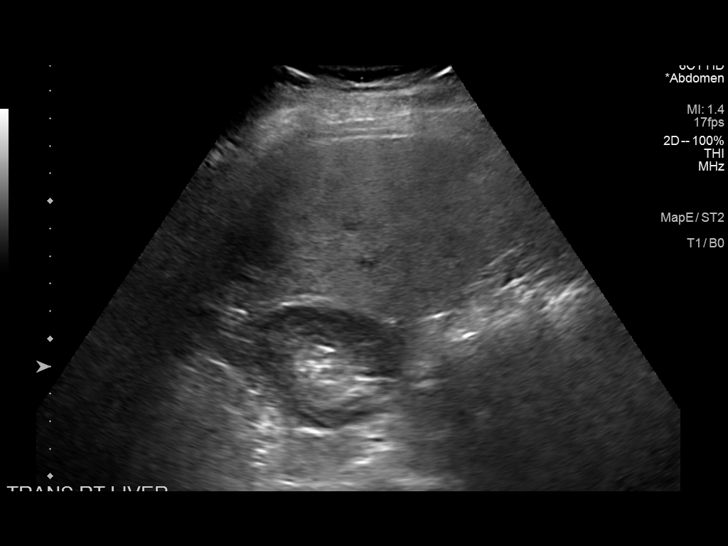
[im 36/95]
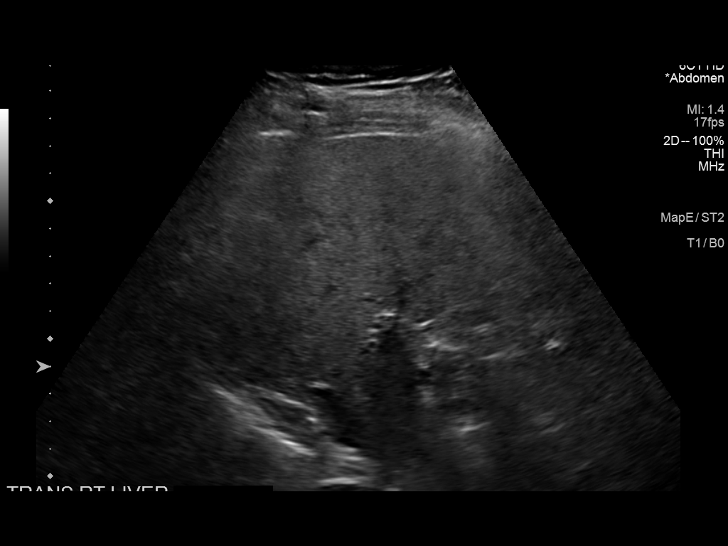
[im 44/95]
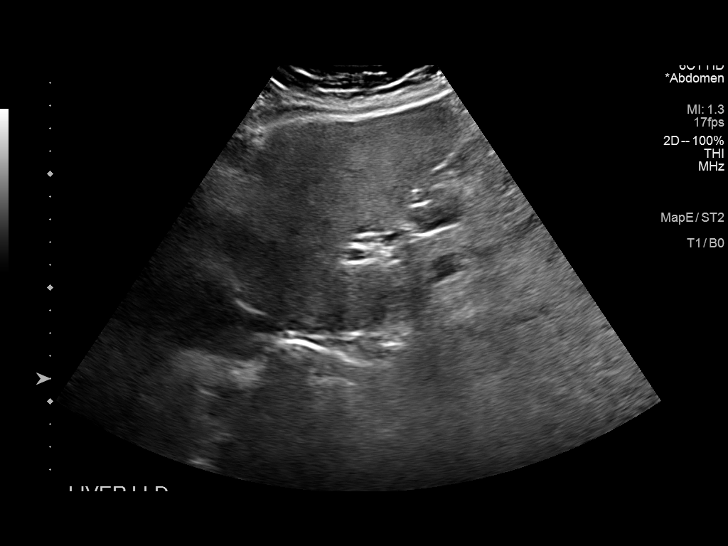
[im 51/95]
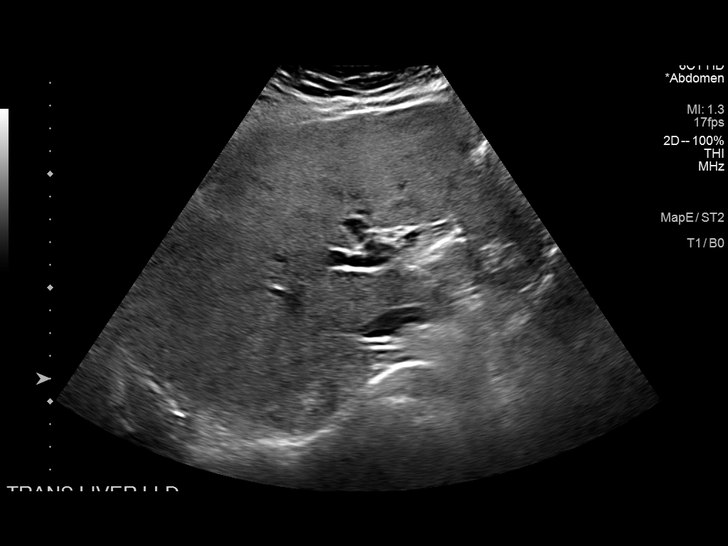
[im 59/95]
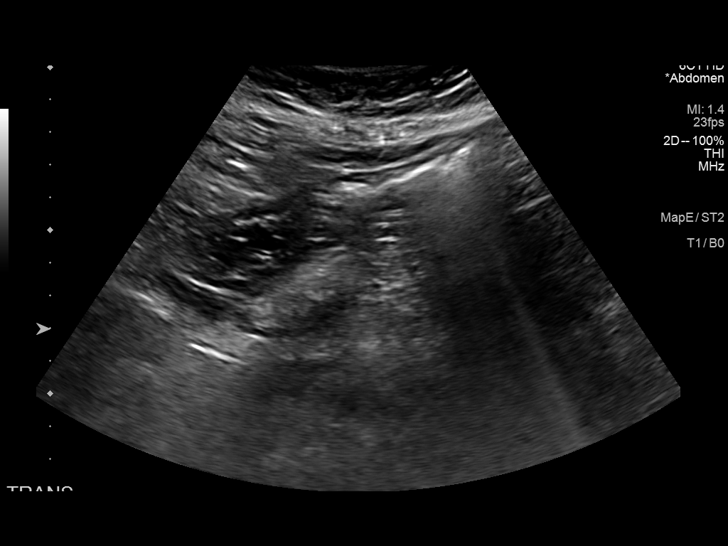
[im 63/95]
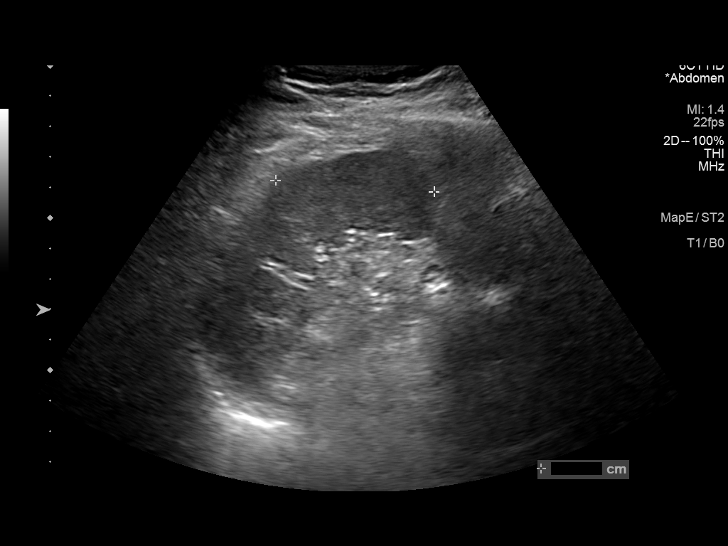
[im 71/95]
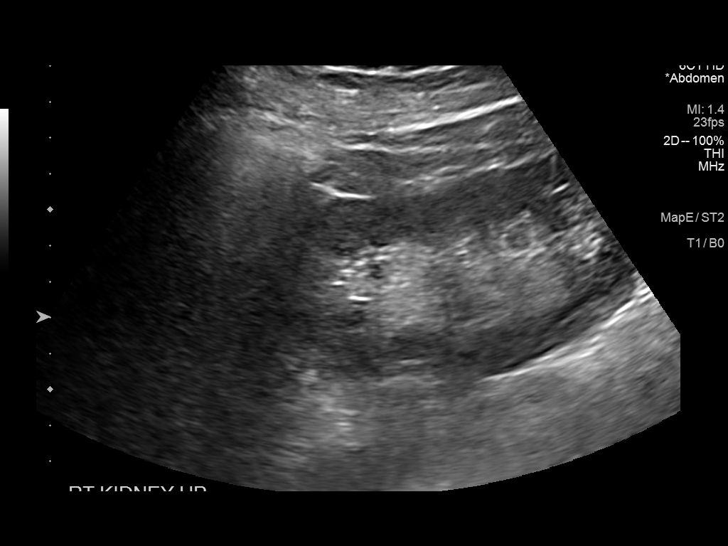
[im 79/95]
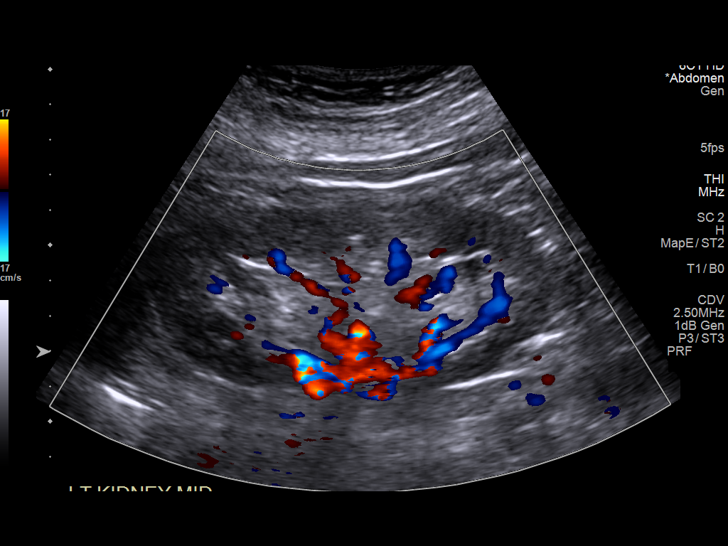
[im 87/95]
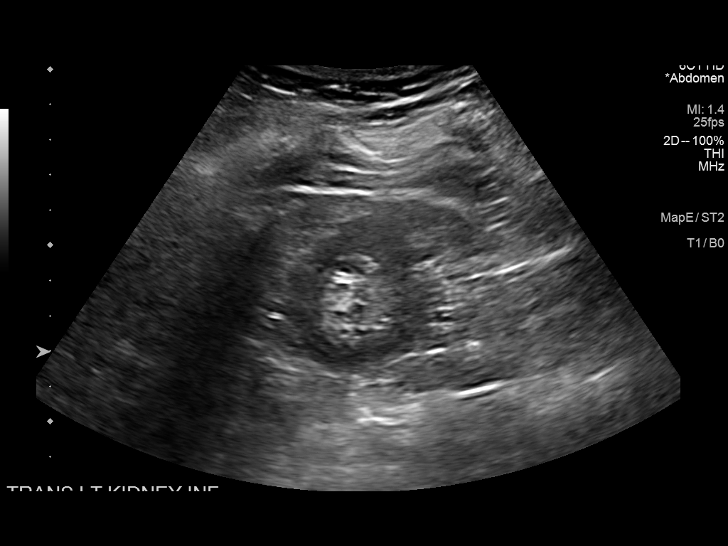
[im 95/95]
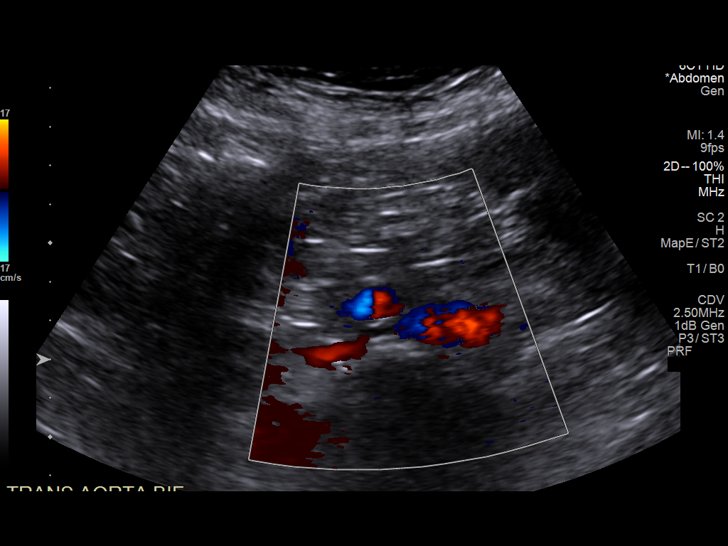

[14 of 25 positions shown; findings below may reference images not displayed]

FINDINGS: Gallbladder: Surgically absent

Common bile duct: Diameter: 7 mm

Liver: Diffusely echogenic. Septated cyst in the right hepatic lobe
measuring 2.6 cm, previously 3.8 cm. Portal vein is patent on color
Doppler imaging with normal direction of blood flow towards the
liver.

IVC: No abnormality visualized.

Pancreas: Visualized portion unremarkable.

Spleen: Size and appearance within normal limits.

Right Kidney: Length: 11.2 cm. Echogenicity within normal limits. No
mass or hydronephrosis visualized.

Left Kidney: Length: 11.4 cm. Echogenicity within normal limits. No
mass or hydronephrosis visualized.

Abdominal aorta: No aneurysm visualized.

Other findings: None.
IMPRESSION: 1. Status post cholecystectomy without significant biliary
dilatation.
2. Echogenic liver consistent with steatosis.  Hepatic cyst

## 2021-11-06 DIAGNOSIS — E6609 Other obesity due to excess calories: Secondary | ICD-10-CM | POA: Diagnosis not present

## 2021-11-06 DIAGNOSIS — E782 Mixed hyperlipidemia: Secondary | ICD-10-CM | POA: Diagnosis not present

## 2021-11-06 DIAGNOSIS — K7689 Other specified diseases of liver: Secondary | ICD-10-CM | POA: Diagnosis not present

## 2021-11-06 DIAGNOSIS — K219 Gastro-esophageal reflux disease without esophagitis: Secondary | ICD-10-CM | POA: Diagnosis not present

## 2021-11-06 DIAGNOSIS — J209 Acute bronchitis, unspecified: Secondary | ICD-10-CM | POA: Diagnosis not present

## 2021-11-06 DIAGNOSIS — H409 Unspecified glaucoma: Secondary | ICD-10-CM | POA: Diagnosis not present

## 2021-11-06 DIAGNOSIS — M1711 Unilateral primary osteoarthritis, right knee: Secondary | ICD-10-CM | POA: Diagnosis not present

## 2021-11-06 DIAGNOSIS — Z86010 Personal history of colonic polyps: Secondary | ICD-10-CM | POA: Diagnosis not present

## 2021-11-24 DIAGNOSIS — E782 Mixed hyperlipidemia: Secondary | ICD-10-CM | POA: Diagnosis not present

## 2021-12-09 ENCOUNTER — Encounter: Payer: Medicare Other | Admitting: Dietician

## 2021-12-17 DIAGNOSIS — H04123 Dry eye syndrome of bilateral lacrimal glands: Secondary | ICD-10-CM | POA: Diagnosis not present

## 2021-12-17 DIAGNOSIS — H401131 Primary open-angle glaucoma, bilateral, mild stage: Secondary | ICD-10-CM | POA: Diagnosis not present

## 2021-12-17 DIAGNOSIS — H43392 Other vitreous opacities, left eye: Secondary | ICD-10-CM | POA: Diagnosis not present

## 2021-12-17 DIAGNOSIS — H2513 Age-related nuclear cataract, bilateral: Secondary | ICD-10-CM | POA: Diagnosis not present

## 2022-02-20 ENCOUNTER — Ambulatory Visit
Admission: RE | Admit: 2022-02-20 | Discharge: 2022-02-20 | Disposition: A | Discharge status: Home / Self Care | Payer: Medicare Other | Source: Ambulatory Visit | Attending: Family Medicine | Admitting: Family Medicine

## 2022-02-20 DIAGNOSIS — R928 Other abnormal and inconclusive findings on diagnostic imaging of breast: Secondary | ICD-10-CM | POA: Diagnosis not present

## 2022-02-20 DIAGNOSIS — N6002 Solitary cyst of left breast: Secondary | ICD-10-CM | POA: Diagnosis not present

## 2022-03-18 DIAGNOSIS — K219 Gastro-esophageal reflux disease without esophagitis: Secondary | ICD-10-CM | POA: Diagnosis not present

## 2022-03-18 DIAGNOSIS — E6609 Other obesity due to excess calories: Secondary | ICD-10-CM | POA: Diagnosis not present

## 2022-03-18 DIAGNOSIS — M1711 Unilateral primary osteoarthritis, right knee: Secondary | ICD-10-CM | POA: Diagnosis not present

## 2022-03-18 DIAGNOSIS — H409 Unspecified glaucoma: Secondary | ICD-10-CM | POA: Diagnosis not present

## 2022-03-18 DIAGNOSIS — E782 Mixed hyperlipidemia: Secondary | ICD-10-CM | POA: Diagnosis not present

## 2022-03-18 DIAGNOSIS — H04123 Dry eye syndrome of bilateral lacrimal glands: Secondary | ICD-10-CM | POA: Diagnosis not present

## 2022-03-24 DIAGNOSIS — E782 Mixed hyperlipidemia: Secondary | ICD-10-CM | POA: Diagnosis not present

## 2022-03-26 ENCOUNTER — Ambulatory Visit
Admission: RE | Admit: 2022-03-26 | Discharge: 2022-03-26 | Disposition: A | Discharge status: Home / Self Care | Payer: Medicare Other | Source: Ambulatory Visit | Attending: Sports Medicine | Admitting: Sports Medicine

## 2022-03-26 ENCOUNTER — Other Ambulatory Visit: Payer: Self-pay | Admitting: Sports Medicine

## 2022-03-26 DIAGNOSIS — M25561 Pain in right knee: Secondary | ICD-10-CM

## 2022-04-17 ENCOUNTER — Other Ambulatory Visit: Payer: Self-pay | Admitting: Family Medicine

## 2022-04-17 DIAGNOSIS — Z1389 Encounter for screening for other disorder: Secondary | ICD-10-CM | POA: Diagnosis not present

## 2022-04-17 DIAGNOSIS — Z23 Encounter for immunization: Secondary | ICD-10-CM | POA: Diagnosis not present

## 2022-04-17 DIAGNOSIS — M8580 Other specified disorders of bone density and structure, unspecified site: Secondary | ICD-10-CM

## 2022-04-17 DIAGNOSIS — Z0000 Encounter for general adult medical examination without abnormal findings: Secondary | ICD-10-CM | POA: Diagnosis not present

## 2022-04-24 DIAGNOSIS — M1711 Unilateral primary osteoarthritis, right knee: Secondary | ICD-10-CM | POA: Diagnosis not present

## 2022-05-27 DIAGNOSIS — H401131 Primary open-angle glaucoma, bilateral, mild stage: Secondary | ICD-10-CM | POA: Diagnosis not present

## 2022-05-27 DIAGNOSIS — H18413 Arcus senilis, bilateral: Secondary | ICD-10-CM | POA: Diagnosis not present

## 2022-05-27 DIAGNOSIS — H2513 Age-related nuclear cataract, bilateral: Secondary | ICD-10-CM | POA: Diagnosis not present

## 2022-05-27 DIAGNOSIS — H04123 Dry eye syndrome of bilateral lacrimal glands: Secondary | ICD-10-CM | POA: Diagnosis not present

## 2022-06-19 DIAGNOSIS — Z23 Encounter for immunization: Secondary | ICD-10-CM | POA: Diagnosis not present

## 2022-09-13 IMAGING — MG DIGITAL DIAGNOSTIC BILAT W/ TOMO W/ CAD
8 series · 8 of 24 positions shown · non-contrast
Comparison: Previous exams including diagnostic mammogram and
ultrasound dated 02/19/2021.

CLINICAL DATA: Follow-up for probably benign cyst in the LEFT
breast. This probably benign finding was initially identified in Tuesday December, 2020.

EXAM:
DIGITAL DIAGNOSTIC BILATERAL MAMMOGRAM WITH TOMOSYNTHESIS AND CAD;
ULTRASOUND LEFT BREAST LIMITED
TECHNIQUE: Bilateral digital diagnostic mammography and breast tomosynthesis
was performed. The images were evaluated with computer-aided
detection.; Targeted ultrasound examination of the left breast was
performed.

[L CC synth-2D]
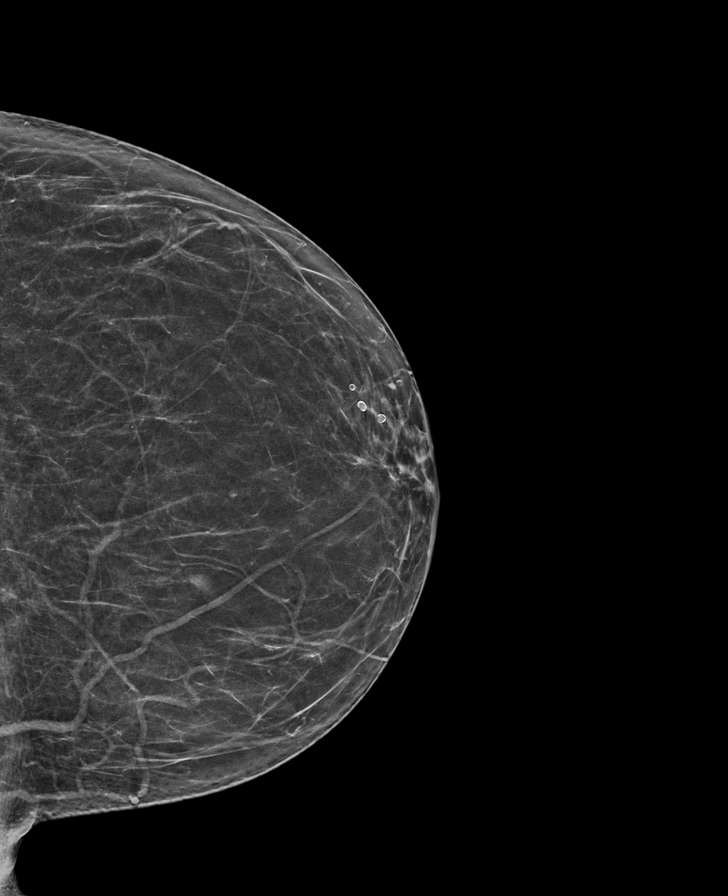

[R MLO synth-2D]
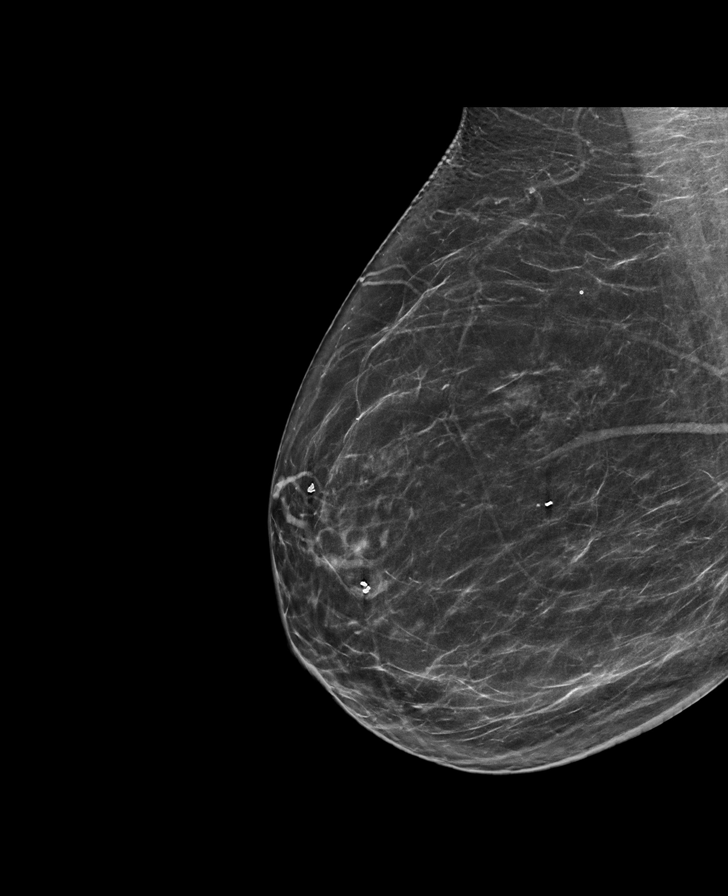

[R CC synth-2D]
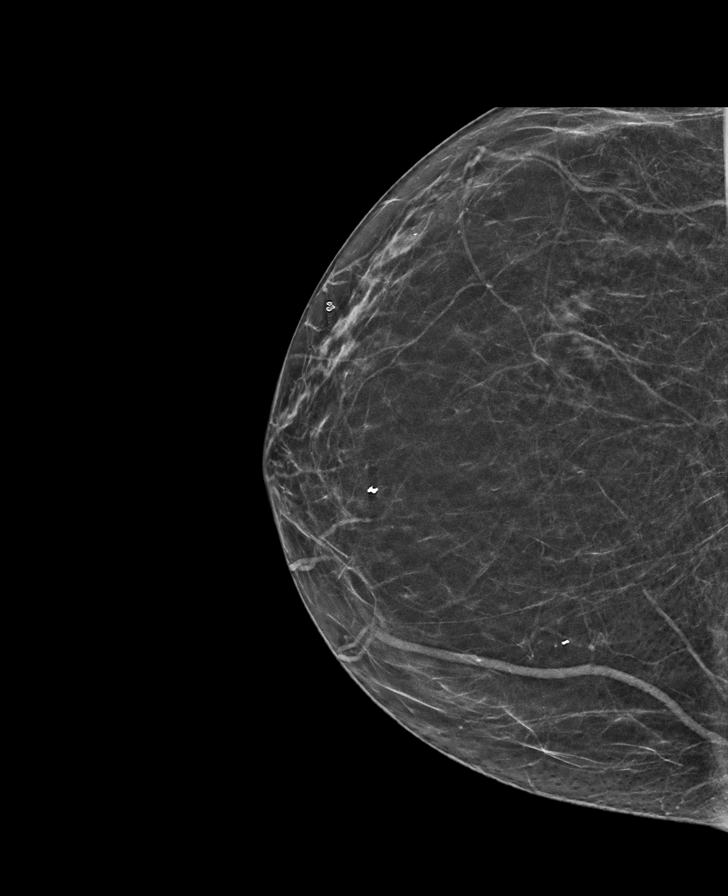

[L MLO synth-2D]
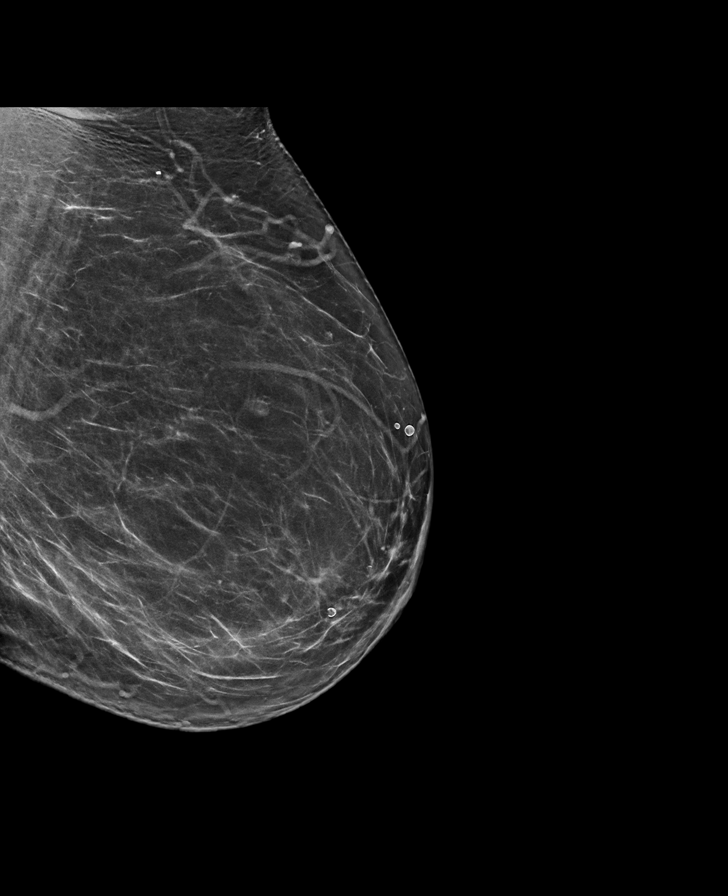

[L CC tomo · tomo slice 31/60.0]
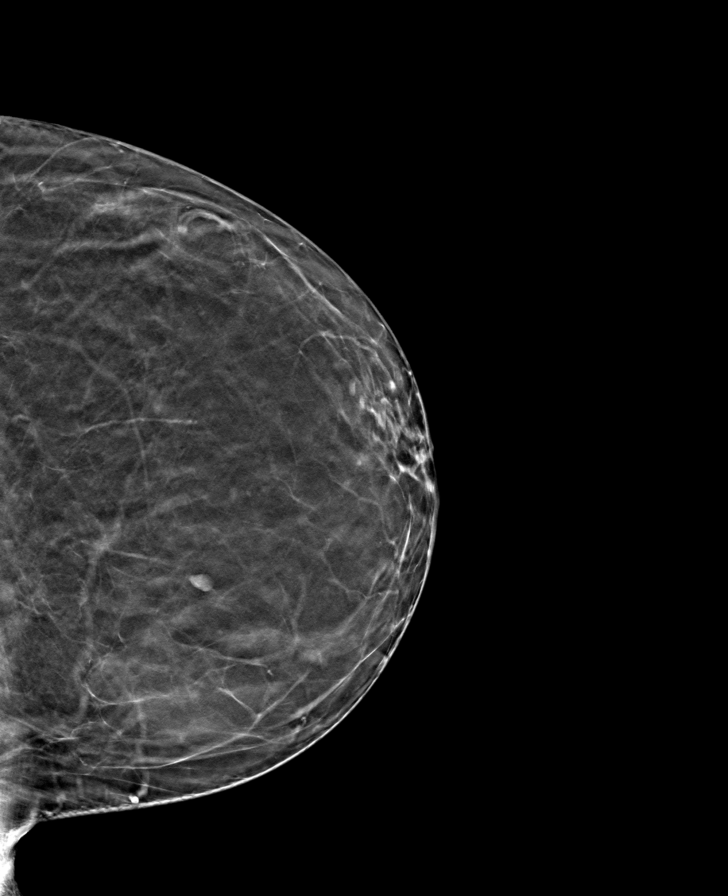

[R MLO tomo · tomo slice 38/75.0]
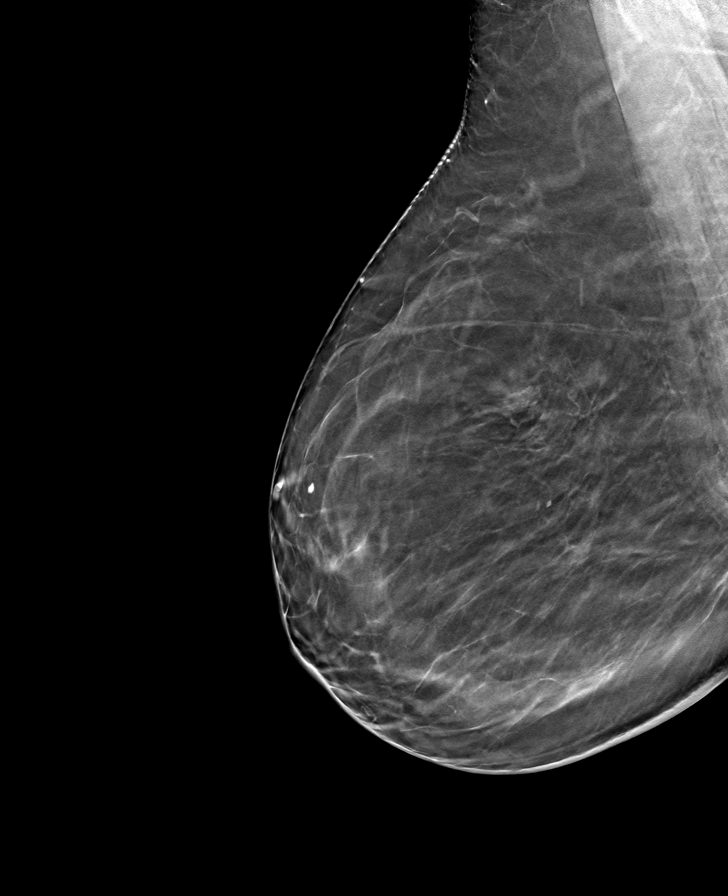

[R CC tomo · tomo slice 29/58.0]
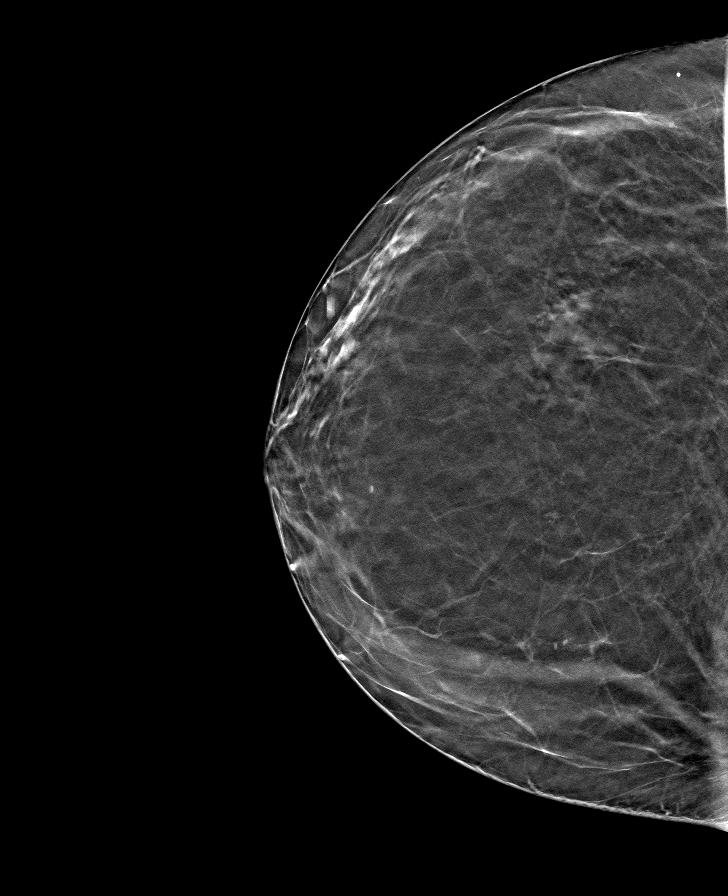

[L MLO tomo · tomo slice 39/76.0]
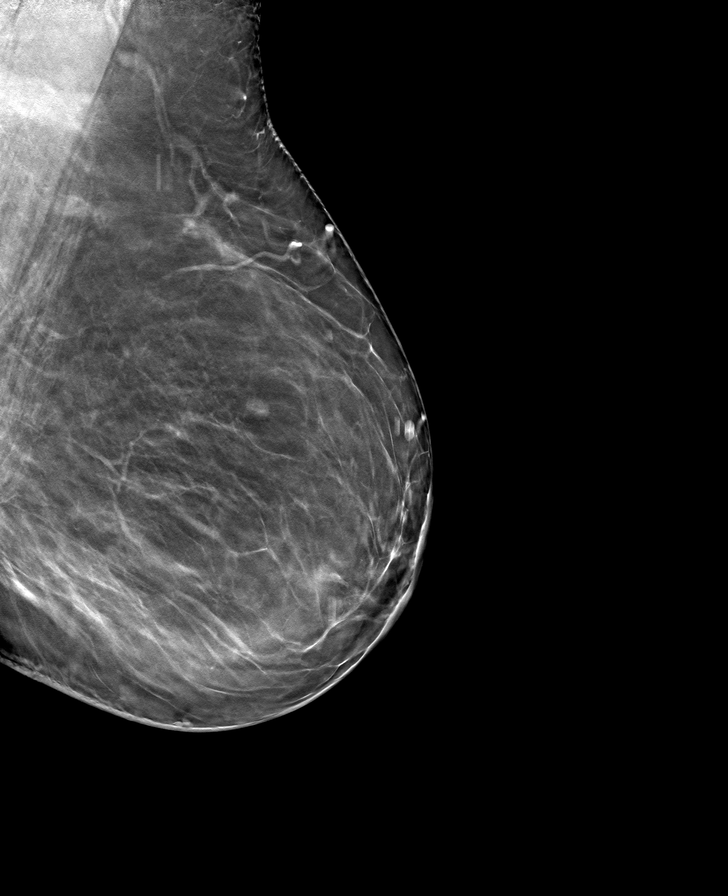

[8 of 24 positions shown; findings below may reference images not displayed]

ACR Breast Density Category b: There are scattered areas of
fibroglandular density.
FINDINGS: The previously demonstrated mass within the LEFT breast is stable to
slightly smaller. There are no new dominant masses, suspicious
calcifications or secondary signs of malignancy identified within
either breast.

Targeted ultrasound is performed, again showing an oval
circumscribed nearly anechoic mass within the LEFT breast at the 10
o'clock axis, 4 cm from the nipple, measuring 5 x 2 x 5 mm, not
significantly changed compared to previous ultrasounds, most likely
a minimally complicated cyst.
IMPRESSION: 1. Probably benign minimally complicated cyst within the LEFT breast
at the 10 o'clock axis, 4 cm from the nipple, measuring 5 x 2 x 5
mm, not significantly changed compared to previous exams. Recommend
additional follow-up diagnostic mammogram, and possible ultrasound,
in 12 months to ensure 2 year stability.
2. No evidence of malignancy within the RIGHT breast.

RECOMMENDATION:
Bilateral diagnostic mammogram, and possible LEFT breast ultrasound,
in 12 months.

I have discussed the findings and recommendations with the patient.
If applicable, a reminder letter will be sent to the patient
regarding the next appointment.

BI-RADS CATEGORY  3: Probably benign.

## 2022-09-29 DIAGNOSIS — R7301 Impaired fasting glucose: Secondary | ICD-10-CM | POA: Diagnosis not present

## 2022-09-29 DIAGNOSIS — Z6833 Body mass index (BMI) 33.0-33.9, adult: Secondary | ICD-10-CM | POA: Diagnosis not present

## 2022-09-29 DIAGNOSIS — K219 Gastro-esophageal reflux disease without esophagitis: Secondary | ICD-10-CM | POA: Diagnosis not present

## 2022-09-29 DIAGNOSIS — E782 Mixed hyperlipidemia: Secondary | ICD-10-CM | POA: Diagnosis not present

## 2022-09-30 DIAGNOSIS — Z0100 Encounter for examination of eyes and vision without abnormal findings: Secondary | ICD-10-CM | POA: Diagnosis not present

## 2022-09-30 DIAGNOSIS — H18413 Arcus senilis, bilateral: Secondary | ICD-10-CM | POA: Diagnosis not present

## 2022-09-30 DIAGNOSIS — H43392 Other vitreous opacities, left eye: Secondary | ICD-10-CM | POA: Diagnosis not present

## 2022-09-30 DIAGNOSIS — H401131 Primary open-angle glaucoma, bilateral, mild stage: Secondary | ICD-10-CM | POA: Diagnosis not present

## 2022-09-30 DIAGNOSIS — H2513 Age-related nuclear cataract, bilateral: Secondary | ICD-10-CM | POA: Diagnosis not present

## 2022-11-19 DIAGNOSIS — H401131 Primary open-angle glaucoma, bilateral, mild stage: Secondary | ICD-10-CM | POA: Diagnosis not present

## 2022-11-19 DIAGNOSIS — H43813 Vitreous degeneration, bilateral: Secondary | ICD-10-CM | POA: Diagnosis not present

## 2022-11-19 DIAGNOSIS — H2513 Age-related nuclear cataract, bilateral: Secondary | ICD-10-CM | POA: Diagnosis not present

## 2022-11-19 DIAGNOSIS — H04123 Dry eye syndrome of bilateral lacrimal glands: Secondary | ICD-10-CM | POA: Diagnosis not present

## 2023-01-21 ENCOUNTER — Other Ambulatory Visit: Payer: Self-pay | Admitting: Family Medicine

## 2023-01-21 DIAGNOSIS — N6002 Solitary cyst of left breast: Secondary | ICD-10-CM

## 2023-02-23 ENCOUNTER — Ambulatory Visit
Admission: RE | Admit: 2023-02-23 | Discharge: 2023-02-23 | Disposition: A | Discharge status: Home / Self Care | Payer: Medicare HMO | Source: Ambulatory Visit | Attending: Family Medicine | Admitting: Family Medicine

## 2023-02-23 ENCOUNTER — Ambulatory Visit
Admission: RE | Admit: 2023-02-23 | Discharge: 2023-02-23 | Disposition: A | Discharge status: Home / Self Care | Payer: Medicare Other | Source: Ambulatory Visit | Attending: Family Medicine | Admitting: Family Medicine

## 2023-02-23 DIAGNOSIS — S6991XA Unspecified injury of right wrist, hand and finger(s), initial encounter: Secondary | ICD-10-CM | POA: Diagnosis not present

## 2023-02-23 DIAGNOSIS — N6002 Solitary cyst of left breast: Secondary | ICD-10-CM | POA: Diagnosis not present

## 2023-03-02 DIAGNOSIS — R2689 Other abnormalities of gait and mobility: Secondary | ICD-10-CM | POA: Diagnosis not present

## 2023-03-02 DIAGNOSIS — Z9181 History of falling: Secondary | ICD-10-CM | POA: Diagnosis not present

## 2023-03-02 DIAGNOSIS — H9312 Tinnitus, left ear: Secondary | ICD-10-CM | POA: Diagnosis not present

## 2023-03-02 DIAGNOSIS — S6991XD Unspecified injury of right wrist, hand and finger(s), subsequent encounter: Secondary | ICD-10-CM | POA: Diagnosis not present

## 2023-03-03 ENCOUNTER — Encounter: Payer: Self-pay | Admitting: Neurology

## 2023-03-05 DIAGNOSIS — M25531 Pain in right wrist: Secondary | ICD-10-CM | POA: Diagnosis not present

## 2023-03-08 DIAGNOSIS — S60211A Contusion of right wrist, initial encounter: Secondary | ICD-10-CM | POA: Diagnosis not present

## 2023-03-25 DIAGNOSIS — H04123 Dry eye syndrome of bilateral lacrimal glands: Secondary | ICD-10-CM | POA: Diagnosis not present

## 2023-03-25 DIAGNOSIS — H401131 Primary open-angle glaucoma, bilateral, mild stage: Secondary | ICD-10-CM | POA: Diagnosis not present

## 2023-03-25 DIAGNOSIS — H2513 Age-related nuclear cataract, bilateral: Secondary | ICD-10-CM | POA: Diagnosis not present

## 2023-04-07 ENCOUNTER — Ambulatory Visit: Payer: Medicare HMO | Admitting: Neurology

## 2023-04-23 DIAGNOSIS — E669 Obesity, unspecified: Secondary | ICD-10-CM | POA: Diagnosis not present

## 2023-04-23 DIAGNOSIS — Z9181 History of falling: Secondary | ICD-10-CM | POA: Diagnosis not present

## 2023-04-23 DIAGNOSIS — Z0000 Encounter for general adult medical examination without abnormal findings: Secondary | ICD-10-CM | POA: Diagnosis not present

## 2023-04-23 DIAGNOSIS — Z1389 Encounter for screening for other disorder: Secondary | ICD-10-CM | POA: Diagnosis not present

## 2023-04-26 ENCOUNTER — Other Ambulatory Visit: Payer: Self-pay | Admitting: Family Medicine

## 2023-04-26 DIAGNOSIS — M8580 Other specified disorders of bone density and structure, unspecified site: Secondary | ICD-10-CM

## 2023-04-27 NOTE — Progress Notes (Signed)
Initial neurology clinic note  Robyn Bennett MRN: [id number] DOB: 11-10-1950  Referring provider: Jarrett Soho, PA-C  Primary care provider: Ileana Ladd, MD (Inactive)  Reason for consult:  tinnitus, vertigo, imbalance  Subjective:  This is Ms. Robyn Bennett, a 72 y.o. right-handed female with a medical history of HLD, hepatic steatosis, diverticulosis, OA, GERD, glaucoma, dry eyes, TMJ who presents to neurology clinic with tinnitus, intermittent episodes of vertigo and imbalance. The patient is alone today.  Patient has had ringing in her left ear for 3-5 years. She occasionally gets headaches. Patient has had vertigo since 2021. At that time, she would get room spinning and losing her balance. She also felt she lost her balance. She slide down onto the floor. She denies being sweaty or having palpitation. Of note, patient saw Dr. Everlena Cooper in this office on 03/27/20 for headaches and dizziness. MRI brain at that time was unremarkable.  She had no further episodes until 02/23/23. She was walking with a co-worker and all of the sudden fell. She is not sure why this happened. She hurt her right wrist and knee as she fell on them. She was able to get up but felt unsteady like she was floating. She was back to baseline later that day. Since the fall, she has been very cautious and not walking as much. She is not dizzy, but she is scared to be as active.  Currently, she has wavy vision. Like the floor is wavy. She denies dizziness. She sometimes thinks this is due to her glasses. Patient gets headaches, mostly at night. She rates them at 4/10. These can prevent good sleep. She feels the headaches can prevent her from sleeping well. She will not take any medication for these because she does not like medication. She takes several vitamins, including magnesium. She denies neck pain. She drinks 1 cup of coffee in the morning. She does not drink EtOH and is a never smoker.  Regarding her  tinnitus, patient saw audiology in 2021 and told "everything was fine." She also saw ENT 3 years ago for ear pain, TMJ, and HA. She still has pain on the left side of face that is constant and about 3/10. She will sometimes feel an itch in her ear. She was told there may be fluid on her ear, so she used nasal spray for a while. She endorses an old infection of the left ear (otitis media) as a child (40 years old).  Patient has a strong family history of stroke and dementia, so she was worried something was going on with her brain.  MEDICATIONS:  Outpatient Encounter Medications as of 05/06/2023  Medication Sig   ALPHAGAN P 0.1 % SOLN Apply 1 drop to eye 2 (two) times daily.   brimonidine (ALPHAGAN) 0.2 % ophthalmic solution 1 drop into affected eye   ezetimibe (ZETIA) 10 MG tablet Take 10 mg by mouth daily.   pantoprazole (PROTONIX) 40 MG tablet Take 40 mg by mouth daily.   Probiotic CAPS See admin instructions.   simvastatin (ZOCOR) 20 MG tablet Take 20 mg by mouth daily.   No facility-administered encounter medications on file as of 05/06/2023.    PAST MEDICAL HISTORY: Past Medical History:  Diagnosis Date   Glaucoma    uses medication (drops)   Hyperlipemia    meds    PAST SURGICAL HISTORY: Past Surgical History:  Procedure Laterality Date   ABDOMINAL HERNIA REPAIR     x 2   APPENDECTOMY  1974  BREAST BIOPSY Right ?2014   CESAREAN SECTION  1975   CHOLECYSTECTOMY, LAPAROSCOPIC     TONSILLECTOMY      ALLERGIES: Allergies  Allergen Reactions   Aspirin     Cause her stomach to bleed   Lactose Intolerance (Gi) Other (See Comments)    FAMILY HISTORY: Family History  Problem Relation Age of Onset   Stroke Mother    Stroke Father    Heart attack Father    Multiple sclerosis Other     SOCIAL HISTORY: Social History   Tobacco Use   Smoking status: Never   Smokeless tobacco: Never  Vaping Use   Vaping status: Never Used  Substance Use Topics   Alcohol use: Not  Currently   Drug use: Never   Social History   Social History Narrative   Right and left handed   Lives alone stay on third fl of apartment      Are you currently employed ?    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   Who lives with you?    Caffeine one cup a day        Objective:  Vital Signs:  BP (!) 144/73   Pulse 73   Ht 4\' 10"  (1.473 m)   Wt 162 lb (73.5 kg)   SpO2 98%   BMI 33.86 kg/m   General: No acute distress.  Patient appears well-groomed.   Head:  Normocephalic/atraumatic. Dislocation felt at left TMJ with opening and closing of jaw. No pain to palpation of TMJ or bilateral temples. Eyes:  fundi examined, disc margins crisp. No obvious papilledema. Neck: supple, no paraspinal tenderness, full range of motion Back: No paraspinal tenderness Heart: regular rate and rhythm Lungs: Clear to auscultation bilaterally. Vascular: No carotid bruits.  Neurological Exam: Mental status: alert and oriented, speech fluent and not dysarthric, language intact. Able to name and repeat.  Cranial nerves: CN I: not tested CN II: pupils equal, round and reactive to light, visual fields intact CN III, IV, VI:  full range of motion, no nystagmus, no ptosis CN V: facial sensation intact. CN VII: upper and lower face symmetric CN VIII: hearing intact CN IX, X: uvula midline CN XI: sternocleidomastoid and trapezius muscles intact CN XII: tongue midline  Bulk & Tone: normal, no fasciculations. Motor:  muscle strength 5/5 throughout Deep Tendon Reflexes:  2+ throughout, except trace at bilateral ankles, toes downgoing.   Sensation:  Pinprick, temperature and vibratory sensation intact. Finger to nose testing:  Without dysmetria.   Heel to shin:  Without dysmetria.   Gait:  Normal station and stride.  Romberg negative.   Labs and Imaging review: External labs: 02/11/21: Lipid panel: total cholesterol 269, LDL 172, TG 279 CMP  unremarkable  06/16/17: Vit D: 20.3 TSH wnl B12: 870 ESR wnl  Imaging: MRI brain w/wo contrast (04/20/20): FINDINGS: BRAIN: No acute infarct, acute hemorrhage or extra-axial collection. Multifocal white matter hyperintensity, most commonly due to chronic ischemic microangiopathy. Normal volume of CSF spaces. No chronic microhemorrhage. Normal midline structures.   VASCULAR: Major flow voids are preserved.   SKULL AND UPPER CERVICAL SPINE: Normal calvarium and skull base. Visualized upper cervical spine and soft tissues are normal.   SINUSES/ORBITS: Right mastoid effusion.  Normal orbits.   IMPRESSION: 1. No acute intracranial abnormality. 2. White matter changes most commonly due to chronic ischemic microangiopathy. 3. Right mastoid effusion.   Assessment/Plan:  Robyn Bennett is a 72 y.o. female  who presents for evaluation of tinnitus, infrequent episodes of vertigo, headache, and a fall. She has a relevant medical history of HLD, hepatic steatosis, diverticulosis, OA, GERD, glaucoma, dry eyes, TMJ. Her neurological examination is essentially normal today. Available diagnostic data is significant for normal MRI brain in 2021. Patient's symptoms are likely multifactorial. Her tinnitus and very rare vertigo may be a consequence of her left ear infection when she was a child. It is not currently bothering her enough that she wants to see audiology again for consideration of hearing aid. The cause of her fall in 01/2023 is unclear. I reassured her that there are no neurology deficits currently, and that she is safe to be active and that this is best for her long term health. In terms of her left head pain, this could be from her known TMJ. We discussed medications, but she did not want to take medication at this time, but was open to natural remedies.  PLAN: -Discussed the importance of regular activity and exercise -OTC recommendations for headaches given (see AVS) - particularly  melatonin that might help with sleep -TMJ regimen of warm compresses, soft diet, OTC analgesics/anti-inflammatories as needed, no gum chewing or ice crunching discussed  -Return to clinic as needed  The impression above as well as the plan as outlined below were extensively discussed with the patient who voiced understanding. All questions were answered to their satisfaction.  The patient was counseled on pertinent fall precautions per the printed material provided today, and as noted under the "Patient Instructions" section below.  When available, results of the above investigations and possible further recommendations will be communicated to the patient via telephone/MyChart. Patient to call office if not contacted after expected testing turnaround time.   Total time spent reviewing records, interview, history/exam, documentation, and coordination of care on day of encounter:  55 min   Thank you for allowing me to participate in patient's care.  If I can answer any additional questions, I would be pleased to do so.  Jacquelyne Balint, MD   CC: Ileana Ladd, MD (Inactive) No address on file  CC: Referring provider: Jarrett Soho, PA-C [address] [place],  [place] [address]

## 2023-04-29 DIAGNOSIS — K760 Fatty (change of) liver, not elsewhere classified: Secondary | ICD-10-CM | POA: Diagnosis not present

## 2023-04-29 DIAGNOSIS — R7301 Impaired fasting glucose: Secondary | ICD-10-CM | POA: Diagnosis not present

## 2023-04-29 DIAGNOSIS — K219 Gastro-esophageal reflux disease without esophagitis: Secondary | ICD-10-CM | POA: Diagnosis not present

## 2023-04-29 DIAGNOSIS — Z6834 Body mass index (BMI) 34.0-34.9, adult: Secondary | ICD-10-CM | POA: Diagnosis not present

## 2023-04-29 DIAGNOSIS — E782 Mixed hyperlipidemia: Secondary | ICD-10-CM | POA: Diagnosis not present

## 2023-05-06 ENCOUNTER — Encounter: Payer: Self-pay | Admitting: Neurology

## 2023-05-06 ENCOUNTER — Ambulatory Visit: Payer: Medicare HMO | Admitting: Neurology

## 2023-05-06 VITALS — BP 144/73 | HR 73 | Ht <= 58 in | Wt 162.0 lb

## 2023-05-06 DIAGNOSIS — Z8669 Personal history of other diseases of the nervous system and sense organs: Secondary | ICD-10-CM | POA: Diagnosis not present

## 2023-05-06 DIAGNOSIS — R42 Dizziness and giddiness: Secondary | ICD-10-CM | POA: Diagnosis not present

## 2023-05-06 DIAGNOSIS — R519 Headache, unspecified: Secondary | ICD-10-CM

## 2023-05-06 DIAGNOSIS — R2689 Other abnormalities of gait and mobility: Secondary | ICD-10-CM | POA: Diagnosis not present

## 2023-05-06 DIAGNOSIS — G8929 Other chronic pain: Secondary | ICD-10-CM

## 2023-05-06 DIAGNOSIS — S0300XD Dislocation of jaw, unspecified side, subsequent encounter: Secondary | ICD-10-CM

## 2023-05-06 DIAGNOSIS — Z09 Encounter for follow-up examination after completed treatment for conditions other than malignant neoplasm: Secondary | ICD-10-CM | POA: Diagnosis not present

## 2023-05-06 DIAGNOSIS — W19XXXS Unspecified fall, sequela: Secondary | ICD-10-CM

## 2023-05-06 NOTE — Patient Instructions (Addendum)
Your neurologic exam looks good today. I see no evidence of a neurologic problem like a stroke. The ringing in your ear and occasional vertigo is likely due to your old ear infection that damaged the inner ear.  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 To help with TMJ pain, I recommend: TMJ regimen of warm compresses, soft diet, OTC analgesics/anti-inflammatories as needed, no gum chewing or ice crunching.   You can also try vitamins for headaches.   Vitamins and herbs that show potential to help headaches:  Magnesium: Magnesium (250 mg twice a day or 500 mg at bed) has a relaxant effect on smooth muscles such as blood vessels. Individuals suffering from frequent or daily headache usually have low magnesium levels which can be increase with daily supplementation of 400-750 mg. Three trials found 40-90% average headache reduction when used as a preventative. Magnesium also demonstrated the benefit in menstrually related migraine. Magnesium is part of the messenger system in the serotonin cascade and it is a good muscle relaxant. It is also useful for constipation which can be a side effect of other medications used to treat migraine. Good sources include nuts, whole grains, and tomatoes.  Riboflavin (vitamin B 2) 200 mg twice a day. This vitamin assists nerve cells in the production of ATP a principal energy storing molecule. It is necessary for many chemical reactions in the body. There have been at least 3 clinical trials of riboflavin using 400 mg per day all of which suggested that migraine frequency can be decreased. All 3 trials showed significant improvement in over half of migraine sufferers. The supplement is found in bread, cereal, milk, meat, and poultry. Most Americans get more riboflavin than the recommended daily allowance, however riboflavin deficiency is not necessary for the supplements to help prevent headache.  Feverfew: Feverfew is a common garden herb  native to [place] and popular in [place] as a treatment for disorders typically controlled by aspirin. The mechanism of action is unknown but is believed to be related to a chemical called parthenolide which helps the body use serotonin more effectively. Serotonin helps prevent migraine and assists with resolution when it occurs. Parthenolide also inhibits the release of histamine which is linked to pain and inflammation.  Consistency of active ingredients in different products can be a problem. Some formulations don't have the active ingredient (parthenolide) that prevents migraine. A parthenolide content of 0.2% is generally recommended. Typical dosage is one capsule 3 times a day.  Coenzyme Q10: This is present in almost all cells in the body and is critical component for the conversion of energy. Recent studies have shown that a nutritional supplement of CoQ10 can reduce the frequency of migraine attacks by improving the energy production of cells as with riboflavin. Doses of 150 mg twice a day have been shown to be effective.  Melatonin: Increasing evidence shows correlation between melatonin secretion and headache conditions. Melatonin supplementation has decreased headache intensity and duration. It is widely used as a sleep aid. Sleep is natures way of dealing with migraine. A dose of 3 mg is recommended to start for headaches including cluster headache. Higher doses up to 15 mg has been reviewed for use in Cluster headache and have been used. The rationale behind using melatonin for cluster is that many theories regarding the cause of Cluster headache center around the disruption of the normal circadian rhythm in the brain. This helps restore the normal circadian rhythm.  Ginger: Ginger  has a small amount of antihistamine and anti-inflammatory action which may help headache. It is primarily used for nausea and may aid in the absorption of other medications.  Follow up with me as  needed.  The physicians and staff at [facility] are committed to providing excellent care. You may receive a survey requesting feedback about your experience at our office. We strive to receive "very good" responses to the survey questions. If you feel that your experience would prevent you from giving the office a "very good " response, please contact our office to try to remedy the situation. We may be reached at [phone]. Thank you for taking the time out of your busy day to complete the survey.  Jacquelyne Balint, MD [facility]  Preventing Falls at [facility] are common, often dreaded events in the lives of older people. Aside from the obvious injuries and even death that may result, fall can cause wide-ranging consequences including loss of independence, mental decline, decreased activity and mobility. Younger people are also at risk of falling, especially those with chronic illnesses and fatigue.  Ways to reduce risk for falling Examine diet and medications. Warm foods and alcohol dilate blood vessels, which can lead to dizziness when standing. Sleep aids, antidepressants and pain medications can also increase the likelihood of a fall.  Get a vision exam. Poor vision, cataracts and glaucoma increase the chances of falling.  Check foot gear. Shoes should fit snugly and have a sturdy, nonskid sole and a broad, low heel  Participate in a physician-approved exercise program to build and maintain muscle strength and improve balance and coordination. Programs that use ankle weights or stretch bands are excellent for muscle-strengthening. Water aerobics programs and low-impact Tai Chi programs have also been shown to improve balance and coordination.  Increase vitamin D intake. Vitamin D improves muscle strength and increases the amount of calcium the body is able to absorb and deposit in bones.  How to prevent falls from common hazards Floors - Remove all loose wires, cords, and  throw rugs. Minimize clutter. Make sure rugs are anchored and smooth. Keep furniture in its usual place.  Chairs -- Use chairs with straight backs, armrests and firm seats. Add firm cushions to existing pieces to add height.  Bathroom - Install grab bars and non-skid tape in the tub or shower. Use a bathtub transfer bench or a shower chair with a back support Use an elevated toilet seat and/or safety rails to assist standing from a low surface. Do not use towel racks or bathroom tissue holders to help you stand.  Lighting - Make sure halls, stairways, and entrances are well-lit. Install a night light in your bathroom or hallway. Make sure there is a light switch at the top and bottom of the staircase. Turn lights on if you get up in the middle of the night. Make sure lamps or light switches are within reach of the bed if you have to get up during the night.  Kitchen - Install non-skid rubber mats near the sink and stove. Clean spills immediately. Store frequently used utensils, pots, pans between waist and eye level. This helps prevent reaching and bending. Sit when getting things out of lower cupboards.  Living room/ Bedrooms - Place furniture with wide spaces in between, giving enough room to move around. Establish a route through the living room that gives you something to hold onto as you walk.  Stairs - Make sure treads, rails, and rugs are secure. Install  a rail on both sides of the stairs. If stairs are a threat, it might be helpful to arrange most of your activities on the lower level to reduce the number of times you must climb the stairs.  Entrances and doorways - Install metal handles on the walls adjacent to the doorknobs of all doors to make it more secure as you travel through the doorway.  Tips for maintaining balance Keep at least one hand free at all times. Try using a backpack or fanny pack to hold things rather than carrying them in your hands. Never carry objects in both hands  when walking as this interferes with keeping your balance.  Attempt to swing both arms from front to back while walking. This might require a conscious effort if Parkinson's disease has diminished your movement. It will, however, help you to maintain balance and posture, and reduce fatigue.  Consciously lift your feet off of the ground when walking. Shuffling and dragging of the feet is a common culprit in losing your balance.  When trying to navigate turns, use a "U" technique of facing forward and making a wide turn, rather than pivoting sharply.  Try to stand with your feet shoulder-length apart. When your feet are close together for any length of time, you increase your risk of losing your balance and falling.  Do one thing at a time. Don't try to walk and accomplish another task, such as reading or looking around. The decrease in your automatic reflexes complicates motor function, so the less distraction, the better.  Do not wear rubber or gripping soled shoes, they might "catch" on the floor and cause tripping.  Move slowly when changing positions. Use deliberate, concentrated movements and, if needed, use a grab bar or walking aid. Count 15 seconds between each movement. For example, when rising from a seated position, wait 15 seconds after standing to begin walking.  If balance is a continuous problem, you might want to consider a walking aid such as a cane, walking stick, or walker. Once you've mastered walking with help, you might be ready to try it on your own again.

## 2023-06-28 DIAGNOSIS — Z6834 Body mass index (BMI) 34.0-34.9, adult: Secondary | ICD-10-CM | POA: Diagnosis not present

## 2023-06-28 DIAGNOSIS — R21 Rash and other nonspecific skin eruption: Secondary | ICD-10-CM | POA: Diagnosis not present

## 2023-07-22 DIAGNOSIS — H04123 Dry eye syndrome of bilateral lacrimal glands: Secondary | ICD-10-CM | POA: Diagnosis not present

## 2023-07-22 DIAGNOSIS — H401131 Primary open-angle glaucoma, bilateral, mild stage: Secondary | ICD-10-CM | POA: Diagnosis not present

## 2023-08-20 DIAGNOSIS — Z6835 Body mass index (BMI) 35.0-35.9, adult: Secondary | ICD-10-CM | POA: Diagnosis not present

## 2023-08-20 DIAGNOSIS — L2082 Flexural eczema: Secondary | ICD-10-CM | POA: Diagnosis not present

## 2023-11-03 ENCOUNTER — Ambulatory Visit
Admission: RE | Admit: 2023-11-03 | Discharge: 2023-11-03 | Disposition: A | Discharge status: Home / Self Care | Payer: Medicare HMO | Source: Ambulatory Visit | Attending: Family Medicine | Admitting: Family Medicine

## 2023-11-03 DIAGNOSIS — M8580 Other specified disorders of bone density and structure, unspecified site: Secondary | ICD-10-CM

## 2024-02-29 ENCOUNTER — Other Ambulatory Visit: Payer: Self-pay | Admitting: Family Medicine

## 2024-02-29 DIAGNOSIS — Z1231 Encounter for screening mammogram for malignant neoplasm of breast: Secondary | ICD-10-CM

## 2024-03-01 ENCOUNTER — Inpatient Hospital Stay
Admission: RE | Admit: 2024-03-01 | Discharge: 2024-03-01 | Discharge status: Home / Self Care | Source: Ambulatory Visit | Attending: Family Medicine | Admitting: Family Medicine

## 2024-03-01 ENCOUNTER — Encounter: Payer: Self-pay | Admitting: Radiology

## 2024-03-01 DIAGNOSIS — Z1231 Encounter for screening mammogram for malignant neoplasm of breast: Secondary | ICD-10-CM
